# Patient Record
Sex: Male | Born: 1960
Health system: Southern US, Community
[De-identification: ages and names within clinical notes are randomized; demographics above are authoritative.]

## PROBLEM LIST (undated history)

## (undated) DIAGNOSIS — I10 Essential (primary) hypertension: Secondary | ICD-10-CM

## (undated) DIAGNOSIS — E785 Hyperlipidemia, unspecified: Secondary | ICD-10-CM

## (undated) HISTORY — PX: APPENDECTOMY: SHX54

---

## 2008-06-13 ENCOUNTER — Ambulatory Visit: Payer: Self-pay | Admitting: Family Medicine

## 2008-06-13 DIAGNOSIS — I10 Essential (primary) hypertension: Secondary | ICD-10-CM

## 2008-06-13 DIAGNOSIS — M25579 Pain in unspecified ankle and joints of unspecified foot: Secondary | ICD-10-CM

## 2008-06-13 DIAGNOSIS — E785 Hyperlipidemia, unspecified: Secondary | ICD-10-CM

## 2008-11-04 ENCOUNTER — Ambulatory Visit: Payer: Self-pay | Admitting: Family Medicine

## 2008-11-04 DIAGNOSIS — M75101 Unspecified rotator cuff tear or rupture of right shoulder, not specified as traumatic: Secondary | ICD-10-CM

## 2008-11-04 DIAGNOSIS — M75102 Unspecified rotator cuff tear or rupture of left shoulder, not specified as traumatic: Secondary | ICD-10-CM

## 2011-12-27 ENCOUNTER — Emergency Department
Admission: EM | Admit: 2011-12-27 | Discharge: 2011-12-27 | Disposition: A | Discharge status: Home / Self Care | Payer: Self-pay | Source: Home / Self Care | Attending: Emergency Medicine | Admitting: Emergency Medicine

## 2011-12-27 ENCOUNTER — Encounter: Payer: Self-pay | Admitting: *Deleted

## 2011-12-27 DIAGNOSIS — J069 Acute upper respiratory infection, unspecified: Secondary | ICD-10-CM

## 2011-12-27 DIAGNOSIS — J029 Acute pharyngitis, unspecified: Secondary | ICD-10-CM

## 2011-12-27 HISTORY — DX: Essential (primary) hypertension: I10

## 2011-12-27 HISTORY — DX: Hyperlipidemia, unspecified: E78.5

## 2011-12-27 MED ORDER — PENICILLIN G BENZATHINE 1200000 UNIT/2ML IM SUSP
1.2000 10*6.[IU] | Freq: Once | INTRAMUSCULAR | Status: AC
  Administered 2011-12-27: 1.2 10*6.[IU] via INTRAMUSCULAR

## 2011-12-27 NOTE — ED Notes (Signed)
Patient c/o sore throat x 2 days. Dry cough yesterday.

## 2011-12-27 NOTE — ED Provider Notes (Signed)
History     CSN: [id number]  Arrival date & time 12/27/11  7829   First MD Initiated Contact with Patient 12/27/11 [phone]      Chief Complaint  Patient presents with  . Sore Throat    (Consider location/radiation/quality/duration/timing/severity/associated sxs/prior treatment) HPI Joel James is a 51 y.o. male who complains of onset of cold symptoms for 2 days.  The symptoms are constant and mild-moderate in severity.  He is flying to [place] tomorrow to get married.  He was told that he is allergic to Amoxicillin, but only got nauseated from it as a child (no serious reaction).  He is not taking any medications.   + sore throat +/- cough No pleuritic pain No wheezing No nasal congestion No post-nasal drainage No sinus pain/pressure No chest congestion No itchy/red eyes No earache No hemoptysis No SOB No chills/sweats No fever No nausea No vomiting No abdominal pain No diarrhea No skin rashes No fatigue No myalgias No headache    Past Medical History  Diagnosis Date  . Hypertension   . Hyperlipidemia     Past Surgical History  Procedure Date  . Appendectomy     Family History  Problem Relation Age of Onset  . Heart failure Father   . Hypertension Father   . Diabetes Father     History  Substance Use Topics  . Smoking status: Never Smoker   . Smokeless tobacco: Not on file  . Alcohol Use: Yes      Review of Systems  All other systems reviewed and are negative.    Allergies  Codeine; Penicillins; and Propoxyphene hcl  Home Medications   Current Outpatient Rx  Name Route Sig Dispense Refill  . ASPIRIN 81 MG PO TABS Oral Take 81 mg by mouth daily.    Marland Kitchen OLMESARTAN-AMLODIPINE-HCTZ 40-10-25 MG PO TABS Oral Take by mouth.    Marland Kitchen LIVALO PO Oral Take by mouth.    . TEMAZEPAM 7.5 MG PO CAPS Oral Take 7.5 mg by mouth at bedtime as needed.      BP 136/89  Pulse 68  Temp 98.4 F (36.9 C) (Oral)  Resp 14  Ht 5\' 11"  (1.803 m)  Wt 183 lb (83.008 kg)   BMI 25.52 kg/m2  SpO2 100%  Physical Exam  Nursing note and vitals reviewed. Constitutional: He is oriented to person, place, and time. He appears well-developed and well-nourished.  HENT:  Head: Normocephalic and atraumatic.  Right Ear: Tympanic membrane, external ear and ear canal normal.  Left Ear: Tympanic membrane, external ear and ear canal normal.  Nose: Mucosal edema present.  Mouth/Throat: No oropharyngeal exudate, posterior oropharyngeal edema or posterior oropharyngeal erythema.  Eyes: No scleral icterus.  Neck: Neck supple.  Cardiovascular: Regular rhythm and normal heart sounds.   Pulmonary/Chest: Effort normal and breath sounds normal. No respiratory distress. He has no decreased breath sounds. He has no wheezes. He has no rhonchi.  Neurological: He is alert and oriented to person, place, and time.  Skin: Skin is warm and dry.  Psychiatric: He has a normal mood and affect. His speech is normal.    ED Course  Procedures (including critical care time)  Labs Reviewed - No data to display No results found.   1. Acute pharyngitis   2. Acute upper respiratory infections of unspecified site       MDM  1)  Rapid strep negative.  However, with trip tomorrow and the necessity to be well quickly, will treat with Bicillin IM.  We  kept him in clinic for 20 mins afterward to confirm no immediate reaction.  Will call back if having any other symptoms. I told him that if viral, then will need to run it's course.  He will also be having a medical exam when he gets to [place] because of the wedding.   2)  Use nasal saline solution (over the counter) at least 3 times a day. 3)  Use over the counter decongestants like Zyrtec-D every 12 hours as needed to help with congestion.  If you have hypertension, do not take medicines with sudafed.  4)  Can take tylenol every 6 hours or motrin every 8 hours for pain or fever. 5)  Follow up with your primary doctor if no improvement in 5-7 days,  sooner if increasing pain, fever, or new symptoms.        Marlaine Hind, MD 12/27/11 [phone]

## 2012-02-20 ENCOUNTER — Emergency Department
Admission: EM | Admit: 2012-02-20 | Discharge: 2012-02-20 | Disposition: A | Discharge status: Home / Self Care | Payer: BC Managed Care – PPO | Source: Home / Self Care

## 2012-02-20 ENCOUNTER — Encounter: Payer: Self-pay | Admitting: *Deleted

## 2012-02-20 DIAGNOSIS — S61419A Laceration without foreign body of unspecified hand, initial encounter: Secondary | ICD-10-CM

## 2012-02-20 DIAGNOSIS — S61409A Unspecified open wound of unspecified hand, initial encounter: Secondary | ICD-10-CM

## 2012-02-20 MED ORDER — DOXYCYCLINE HYCLATE 100 MG PO CAPS: 100.0000 mg | ORAL_CAPSULE | Freq: Two times a day (BID) | ORAL | Status: AC

## 2012-02-20 MED ORDER — TETANUS-DIPHTH-ACELL PERTUSSIS 5-2.5-18.5 LF-MCG/0.5 IM SUSP
0.5000 mL | Freq: Once | INTRAMUSCULAR | Status: AC
  Administered 2012-02-20: 0.5 mL via INTRAMUSCULAR

## 2012-02-20 NOTE — ED Notes (Signed)
Pt c/o RT 5th digit laceration x last night. He states that he was pushing the trash down in the trash can at home and cut it on a can. He is unsure of his last Tdap.

## 2012-02-20 NOTE — ED Provider Notes (Signed)
History     CSN: [id number]  Arrival date & time 02/20/12  0856   None     Chief Complaint  Patient presents with  . Extremity Laceration    Patient is a 51 y.o. male presenting with skin laceration.  Laceration  The incident occurred yesterday. The laceration is located on the right hand. Size: 1.5 cm  Injury mechanism: pt was pushing down trash and cut hand on can. The pain is at a severity of 4/10. The pain is mild. The pain has been fluctuating since onset. He reports no foreign bodies present. His tetanus status is unknown.  Pt believes he may have cut hand on old can of dog food.    Past Medical History  Diagnosis Date  . Hypertension   . Hyperlipidemia     Past Surgical History  Procedure Date  . Appendectomy     Family History  Problem Relation Age of Onset  . Heart failure Father   . Hypertension Father   . Diabetes Father   . Hyperlipidemia Father   . Hyperlipidemia Mother   . Hypertension Mother     History  Substance Use Topics  . Smoking status: Never Smoker   . Smokeless tobacco: Not on file  . Alcohol Use: Yes      Review of Systems  All other systems reviewed and are negative.    Allergies  Codeine; Penicillins; and Propoxyphene hcl  Home Medications   Current Outpatient Rx  Name Route Sig Dispense Refill  . ASPIRIN 81 MG PO TABS Oral Take 81 mg by mouth daily.    Marland Kitchen DOXYCYCLINE HYCLATE 100 MG PO CAPS Oral Take 1 capsule (100 mg total) by mouth 2 (two) times daily. 14 capsule 0  . OLMESARTAN-AMLODIPINE-HCTZ 40-10-25 MG PO TABS Oral Take by mouth.    Marland Kitchen LIVALO PO Oral Take by mouth.    . TEMAZEPAM 7.5 MG PO CAPS Oral Take 7.5 mg by mouth at bedtime as needed.      BP 125/82  Pulse 76  Temp 98 F (36.7 C) (Oral)  Resp 18  Ht 5\' 11"  (1.803 m)  Wt 187 lb 4 oz (84.936 kg)  BMI 26.12 kg/m2  SpO2 100%  Physical Exam  Constitutional: He appears well-developed and well-nourished.  HENT:  Head: Normocephalic and atraumatic.  Eyes:  Conjunctivae are normal. Pupils are equal, round, and reactive to light.  Neck: Normal range of motion. Neck supple.  Cardiovascular: Normal rate and regular rhythm.   Pulmonary/Chest: Effort normal and breath sounds normal.  Abdominal: Soft.  Musculoskeletal: Normal range of motion.       Hands:      Noted 5th digit laceration measuring approx 1.5 cm.   Neurological: He is alert.    ED Course  Procedures (including critical care time) Laceration Repair; Area soaked in chlorhexidine, then copiously irrigated with normal saline. Affected area anesthetized with 2% lidocaine without epinephrine. Area sutured in interrupted fashion with 5-0 ethilon. Area dressed with topical antibiotic ointment and wrapping. Overall procedure tolerated well.  Labs Reviewed - No data to display No results found.   1. Hand laceration       MDM  Area irrigated and sutured at bedside.  Tetanus shot given.  Will place on abx as wound has been open for >12 hours as well as etiology of wound (garbage can).  Discussed infectious red flags.  Follow up in 3-5 days for wound recheck. Remove sutures in 7 days.     The  patient and/or caregiver has been counseled thoroughly with regard to treatment plan and/or medications prescribed including dosage, schedule, interactions, rationale for use, and possible side effects and they verbalize understanding. Diagnoses and expected course of recovery discussed and will return if not improved as expected or if the condition worsens. Patient and/or caregiver verbalized understanding.               Doree Albee, MD 02/20/12 1005  Doree Albee, MD 02/20/12 1006

## 2012-02-23 NOTE — ED Provider Notes (Signed)
Agree with exam, assessment, and plan.   Arnet Hofferber A Diante Barley, MD 02/23/12 1430 

## 2012-10-01 ENCOUNTER — Emergency Department
Admission: EM | Admit: 2012-10-01 | Discharge: 2012-10-01 | Disposition: A | Discharge status: Home / Self Care | Payer: BC Managed Care – PPO | Source: Home / Self Care | Attending: Family Medicine | Admitting: Family Medicine

## 2012-10-01 ENCOUNTER — Emergency Department (INDEPENDENT_AMBULATORY_CARE_PROVIDER_SITE_OTHER): Payer: BC Managed Care – PPO

## 2012-10-01 ENCOUNTER — Encounter: Payer: Self-pay | Admitting: *Deleted

## 2012-10-01 ENCOUNTER — Ambulatory Visit (INDEPENDENT_AMBULATORY_CARE_PROVIDER_SITE_OTHER): Payer: BC Managed Care – PPO | Admitting: Sports Medicine

## 2012-10-01 DIAGNOSIS — M67919 Unspecified disorder of synovium and tendon, unspecified shoulder: Secondary | ICD-10-CM

## 2012-10-01 DIAGNOSIS — M25519 Pain in unspecified shoulder: Secondary | ICD-10-CM

## 2012-10-01 DIAGNOSIS — M7551 Bursitis of right shoulder: Secondary | ICD-10-CM

## 2012-10-01 DIAGNOSIS — M75101 Unspecified rotator cuff tear or rupture of right shoulder, not specified as traumatic: Secondary | ICD-10-CM

## 2012-10-01 MED ORDER — MELOXICAM 15 MG PO TABS: ORAL_TABLET | ORAL | Status: DC

## 2012-10-01 NOTE — Progress Notes (Signed)
   Subjective:    I'm seeing this patient as a consultation for:  Dr. Cathren Harsh.  CC: Right shoulder pain  HPI: Right shoulder pain: Has been on and off for approximately 10 years without any trauma. Pain is localized over the deltoid and is worse with overhead activities and reaching behind his back. He denies any pain in his neck he denies any pain radiating down the arm past the elbow or to the fingertips. Has taken some ibuprofen unfortunately pain is starting to wake him up at night.  Symptoms are moderate, worsening. Has never had an injection in never had physical therapy.  Past medical history, Surgical history, Family history not pertinant except as noted below, Social history, Allergies, and medications have been entered into the medical record, reviewed, and no changes needed.   Review of Systems: No headache, visual changes, nausea, vomiting, diarrhea, constipation, dizziness, abdominal pain, skin rash, fevers, chills, night sweats, weight loss, swollen lymph nodes, body aches, joint swelling, muscle aches, chest pain, shortness of breath, mood changes, visual or auditory hallucinations.   Objective:   General: Well Developed, well nourished, and in no acute distress.  Neuro/Psych: Alert and oriented x3, extra-ocular muscles intact, able to move all 4 extremities, sensation grossly intact. Skin: Warm and dry, no rashes noted.  Respiratory: Not using accessory muscles, speaking in full sentences, trachea midline.  Cardiovascular: Pulses palpable, no extremity edema. Abdomen: Does not appear distended. Right Shoulder: Inspection reveals no abnormalities, atrophy or asymmetry. Palpation is normal with no tenderness over AC joint or bicipital groove. ROM is full in all planes. Rotator cuff strength fairly normal, but pain with resisted abduction.  Positive Neer's, Hawkin's, and Empty Can test Speeds and Yergason's tests normal. No labral pathology noted with negative Obrien's,  negative clunk and good stability. Positive crank test. Normal scapular function observed. No painful arc and no drop arm sign. No apprehension sign  X-rays were reviewed and show mild acromioclavicular DJD, otherwise negative.  Procedure:  Injection of right subacromial bursa Consent obtained and verified. Time-out conducted. Noted no overlying erythema, induration, or other signs of local infection. Skin prepped in a sterile fashion. Topical analgesic spray: Ethyl chloride. Completed without difficulty. Meds: 25-gauge needle advanced from a posterior approach skimming just under the acromion. 1 cc Kenalog 40, 3 cc lidocaine injected easily. Pain immediately improved suggesting accurate placement of the medication. Advised to call if fevers/chills, erythema, induration, drainage, or persistent bleeding. Impression and Recommendations:   This case required medical decision making of moderate complexity.

## 2012-10-01 NOTE — ED Notes (Signed)
Pt c/o RT shoulder pain x 1 wk. Denies injury. No OTC meds.

## 2012-10-01 NOTE — Assessment & Plan Note (Signed)
Blind injection into the subacromial bursa as above. Mobic, home exercises. Return in 4 weeks.

## 2012-10-01 NOTE — ED Provider Notes (Signed)
History     CSN: [id number]  Arrival date & time 10/01/12  4098   First MD Initiated Contact with Patient 10/01/12 [phone]      Chief Complaint  Patient presents with  . Shoulder Pain       HPI Comments: Patient complains of pain in right shoulder for one week.  The pain awakens him at night.  He uses a computer mouse most of the day at work.  He recalls no injury.  He has had no recent change in his physical activities.  Patient is a 52 y.o. male presenting with shoulder pain. The history is provided by the patient.  Shoulder Pain This is a recurrent problem. Episode onset: 1 week ago. The problem occurs constantly. The problem has not changed since onset.Associated symptoms comments: none. Exacerbated by: Moving his shoulder. Nothing relieves the symptoms. Treatments tried: Excedrin. The treatment provided no relief.    Past Medical History  Diagnosis Date  . Hypertension   . Hyperlipidemia     Past Surgical History  Procedure Laterality Date  . Appendectomy      Family History  Problem Relation Age of Onset  . Heart failure Father   . Hypertension Father   . Diabetes Father   . Hyperlipidemia Father   . Hyperlipidemia Mother   . Hypertension Mother     History  Substance Use Topics  . Smoking status: Never Smoker   . Smokeless tobacco: Not on file  . Alcohol Use: Yes      Review of Systems  All other systems reviewed and are negative.    Allergies  Codeine; Penicillins; and Propoxyphene hcl  Home Medications   Current Outpatient Rx  Name  Route  Sig  Dispense  Refill  . UNKNOWN TO PATIENT               . aspirin 81 MG tablet   Oral   Take 81 mg by mouth daily.         . meloxicam (MOBIC) 15 MG tablet      One tab PO qAM with breakfast for 2 weeks, then daily prn pain.   30 tablet   3   . Olmesartan-Amlodipine-HCTZ (TRIBENZOR) 40-10-25 MG TABS   Oral   Take by mouth.         . Pitavastatin Calcium (LIVALO PO)   Oral   Take by  mouth.         . temazepam (RESTORIL) 7.5 MG capsule   Oral   Take 7.5 mg by mouth at bedtime as needed.           BP 112/77  Pulse 73  Temp(Src) 97.7 F (36.5 C) (Oral)  Ht 5\' 11"  (1.803 m)  Wt 188 lb (85.276 kg)  BMI 26.23 kg/m2  SpO2 97%  Physical Exam  Nursing note and vitals reviewed. Constitutional: He is oriented to person, place, and time. He appears well-developed and well-nourished. No distress.  HENT:  Head: Normocephalic.  Eyes: Conjunctivae are normal. Pupils are equal, round, and reactive to light.  Neck: Normal range of motion.  Cardiovascular: Normal heart sounds.   Pulmonary/Chest: Breath sounds normal.  Musculoskeletal: Normal range of motion.       Right shoulder: He exhibits tenderness. He exhibits normal range of motion, no bony tenderness, no swelling, no effusion and no crepitus.       Arms: There is mild tenderness at right sub-acromial notch.  Exam relatively benign.  Neer's test is positive for subacromial  impingement.  Lymphadenopathy:    He has no cervical adenopathy.  Neurological: He is alert and oriented to person, place, and time.  Skin: Skin is warm and dry. No rash noted.    ED Course  Procedures  none   Dg Shoulder Right  10/01/2012  *RADIOLOGY REPORT*  Clinical Data: Superior lateral shoulder pain.  RIGHT SHOULDER - 2+ VIEW  Comparison: None.  Findings: No fracture or dislocation.  No degenerative changes. Visualized portion of the right chest is unremarkable.  IMPRESSION: Negative.   Original Report Authenticated By: Leanna Battles, M.D.      1. Acute shoulder bursitis, right       MDM  Will consult with Dr. Rodney Langton for further management, and possible corticosteroid injection.        Lattie Haw, MD 10/01/12 1116

## 2013-10-14 ENCOUNTER — Emergency Department
Admission: EM | Admit: 2013-10-14 | Discharge: 2013-10-14 | Disposition: A | Discharge status: Home / Self Care | Payer: BC Managed Care – PPO | Source: Home / Self Care | Attending: Family Medicine | Admitting: Family Medicine

## 2013-10-14 ENCOUNTER — Emergency Department (INDEPENDENT_AMBULATORY_CARE_PROVIDER_SITE_OTHER): Payer: BC Managed Care – PPO

## 2013-10-14 ENCOUNTER — Encounter: Payer: Self-pay | Admitting: Emergency Medicine

## 2013-10-14 DIAGNOSIS — R079 Chest pain, unspecified: Secondary | ICD-10-CM

## 2013-10-14 LAB — TROPONIN I

## 2013-10-14 NOTE — ED Notes (Signed)
Apt made with Dr. Jens Somrenshaw for 10/23/13 in [place] office.

## 2013-10-14 NOTE — ED Provider Notes (Signed)
CSN: [id number]     Arrival date & time 10/14/13  0909 History   First MD Initiated Contact with Patient 10/14/13 0915     Chief Complaint  Patient presents with  . Chest Pain    HPI Patient presents today with chief complaint of chest pain. Patient reports having left-sided chest pain since yesterday. Denies any associated nausea diaphoresis. Pain does not radiate to the neck or the arm. Pain is not worse with exertion.  Pain seems to be worse with deep breathing, coughing, laughing as well as certain movements. Denies any recent strenuous activity.  Took a full dose aspirin yesterday and today with no change in symptoms. + family hx/o heart disease in father with fatal MI in 4360s, brother s/p CABG + HLD and HTN Had cardiac workup about 20 years ago that was normal.  Last saw a cardiologist 15 years ago.   Past Medical History  Diagnosis Date  . Hypertension   . Hyperlipidemia    Past Surgical History  Procedure Laterality Date  . Appendectomy     Family History  Problem Relation Age of Onset  . Heart failure Father   . Hypertension Father   . Diabetes Father   . Hyperlipidemia Father   . Hyperlipidemia Mother   . Hypertension Mother    History  Substance Use Topics  . Smoking status: Never Smoker   . Smokeless tobacco: Not on file  . Alcohol Use: Yes    Review of Systems  All other systems reviewed and are negative.   Allergies  Codeine; Penicillins; and Propoxyphene hcl  Home Medications   Current Outpatient Rx  Name  Route  Sig  Dispense  Refill  . aspirin 81 MG tablet   Oral   Take 81 mg by mouth daily.         . meloxicam (MOBIC) 15 MG tablet      One tab PO qAM with breakfast for 2 weeks, then daily prn pain.   30 tablet   3   . Olmesartan-Amlodipine-HCTZ (TRIBENZOR) 40-10-25 MG TABS   Oral   Take by mouth.         . Pitavastatin Calcium (LIVALO PO)   Oral   Take by mouth.         . temazepam (RESTORIL) 7.5 MG capsule   Oral   Take  7.5 mg by mouth at bedtime as needed.         Marland Kitchen. UNKNOWN TO PATIENT                There were no vitals taken for this visit. Physical Exam  Constitutional: He appears well-developed and well-nourished.  HENT:  Head: Normocephalic and atraumatic.  Eyes: Conjunctivae are normal. Pupils are equal, round, and reactive to light.  Neck: Normal range of motion. Neck supple.  Cardiovascular: Normal rate and regular rhythm.   Pulmonary/Chest: Effort normal and breath sounds normal.  + mild L sided CP with thoracic expansion.  Abdominal: Soft.  Musculoskeletal: Normal range of motion.  Neurological: He is alert.  Skin: Skin is warm.    ED Course  Procedures (including critical care time) Labs Review Labs Reviewed - No data to display Imaging Review Dg Chest 2 View  10/14/2013   CLINICAL DATA:  53 year old male with chest pain. Initial encounter.  EXAM: CHEST  2 VIEW  COMPARISON:  11/04/2008.  FINDINGS: Mildly lower lung volumes. Normal cardiac size and mediastinal contours. Visualized tracheal air column is within normal limits. The lungs  remain clear. No pneumothorax or effusion. No acute osseous abnormality identified.  IMPRESSION: Lower lung volumes otherwise negative, with no acute cardiopulmonary abnormality.   Electronically Signed   By: Augusto GambleLee  Hall M.D.   On: 10/14/2013 09:55   EKG: NSR   MDM   1. Chest pain    Sxs atypical for cardiac source. More consisent with MSK component as pain is reproducible with movement and deep breathing. ? Costochondritis.  CXR, EKG, trop x1 negative.  Continue mobic, prn tylenol.  Continue daily ASA.  Still at higher risk of cardiac disease given RFs.  Will set up for referral to cardiology for stress test.  Go to ER if sxs worsen or change.     The patient and/or caregiver has been counseled thoroughly with regard to treatment plan and/or medications prescribed including dosage, schedule, interactions, rationale for use, and possible side  effects and they verbalize understanding. Diagnoses and expected course of recovery discussed and will return if not improved as expected or if the condition worsens. Patient and/or caregiver verbalized understanding.          Doree AlbeeSteven Newton, MD 10/14/13 1228

## 2013-10-14 NOTE — ED Notes (Signed)
Joel James c/o left sided CP that started suddenly yesterday. Pain is constant and described as sharp and dull. No associated symptoms such as N/V, diaphoresis, left arm numbness, or jaw pain. Neg smoking Hx. Positive family Hx of MI. He has already taken his 325 ASA today.

## 2013-10-16 ENCOUNTER — Ambulatory Visit: Payer: BC Managed Care – PPO | Admitting: Cardiology

## 2013-10-17 ENCOUNTER — Telehealth: Payer: Self-pay | Admitting: Emergency Medicine

## 2013-10-23 ENCOUNTER — Ambulatory Visit: Payer: BC Managed Care – PPO | Admitting: Cardiology

## 2013-12-13 ENCOUNTER — Ambulatory Visit (INDEPENDENT_AMBULATORY_CARE_PROVIDER_SITE_OTHER): Payer: BC Managed Care – PPO | Admitting: Sports Medicine

## 2013-12-13 ENCOUNTER — Encounter: Payer: Self-pay | Admitting: Sports Medicine

## 2013-12-13 VITALS — BP 125/81 | HR 70 | Ht 71.0 in | Wt 191.0 lb

## 2013-12-13 DIAGNOSIS — M719 Bursopathy, unspecified: Secondary | ICD-10-CM

## 2013-12-13 DIAGNOSIS — M67919 Unspecified disorder of synovium and tendon, unspecified shoulder: Secondary | ICD-10-CM

## 2013-12-13 DIAGNOSIS — M75101 Unspecified rotator cuff tear or rupture of right shoulder, not specified as traumatic: Secondary | ICD-10-CM

## 2013-12-13 NOTE — Assessment & Plan Note (Signed)
Solid three-month response to last subacromial injection. Repeat subacromial injection this time under guidance. Return as needed.

## 2013-12-13 NOTE — Progress Notes (Signed)
    Subjective:    CC: Followup  HPI: Right rotator cuff syndrome: Excellent response to previous subacromial injection, desires repeat, pain is worse of the deltoid, worse with overhead activities, moderate, persistent without trauma.  Past medical history, Surgical history, Family history not pertinant except as noted below, Social history, Allergies, and medications have been entered into the medical record, reviewed, and no changes needed.   Review of Systems: No fevers, chills, night sweats, weight loss, chest pain, or shortness of breath.   Objective:    General: Well Developed, well nourished, and in no acute distress.  Neuro: Alert and oriented x3, extra-ocular muscles intact, sensation grossly intact.  HEENT: Normocephalic, atraumatic, pupils equal round reactive to light, neck supple, no masses, no lymphadenopathy, thyroid nonpalpable.  Skin: Warm and dry, no rashes. Cardiac: Regular rate and rhythm, no murmurs rubs or gallops, no lower extremity edema. Respiratory: Clear to auscultation bilaterally. Not using accessory muscles, speaking in full sentences.  Procedure: Real-time Ultrasound Guided Injection of right subacromial bursa Device: GE Logiq E  Verbal informed consent obtained.  Time-out conducted.  Noted no overlying erythema, induration, or other signs of local infection.  Skin prepped in a sterile fashion.  Local anesthesia: Topical Ethyl chloride.  With sterile technique and under real time ultrasound guidance: 1 cc Kenalog 40, 3 cc lidocaine injected easily into the subacromial bursa.  Completed without difficulty  Pain immediately resolved suggesting accurate placement of the medication.  Advised to call if fevers/chills, erythema, induration, drainage, or persistent bleeding.  Images permanently stored and available for review in the ultrasound unit.  Impression: Technically successful ultrasound guided injection.  Impression and Recommendations:

## 2014-04-23 ENCOUNTER — Ambulatory Visit (INDEPENDENT_AMBULATORY_CARE_PROVIDER_SITE_OTHER): Payer: BC Managed Care – PPO | Admitting: Sports Medicine

## 2014-04-23 ENCOUNTER — Encounter: Payer: Self-pay | Admitting: Sports Medicine

## 2014-04-23 VITALS — BP 114/76 | HR 67 | Ht 71.0 in | Wt 189.0 lb

## 2014-04-23 DIAGNOSIS — M75101 Unspecified rotator cuff tear or rupture of right shoulder, not specified as traumatic: Secondary | ICD-10-CM

## 2014-04-23 NOTE — Progress Notes (Signed)
  Subjective:    CC: Right shoulder pain  HPI: This is a pleasant 53 year old male, I saw him approximately 4 months ago, he had a rotator cuff-type syndrome and we injected the subacromial bursa. He had a four-month response but now has recurrence of pain at this time in a different location, posteriorly at the joint line, worse with reaching behind/external rotation.  Past medical history, Surgical history, Family history not pertinant except as noted below, Social history, Allergies, and medications have been entered into the medical record, reviewed, and no changes needed.   Review of Systems: No fevers, chills, night sweats, weight loss, chest pain, or shortness of breath.   Objective:    General: Well Developed, well nourished, and in no acute distress.  Neuro: Alert and oriented x3, extra-ocular muscles intact, sensation grossly intact.  HEENT: Normocephalic, atraumatic, pupils equal round reactive to light, neck supple, no masses, no lymphadenopathy, thyroid nonpalpable.  Skin: Warm and dry, no rashes. Cardiac: Regular rate and rhythm, no murmurs rubs or gallops, no lower extremity edema.  Respiratory: Clear to auscultation bilaterally. Not using accessory muscles, speaking in full sentences. Right Shoulder: Inspection reveals no abnormalities, atrophy or asymmetry. Palpation is normal with no tenderness over AC joint or bicipital groove. ROM is full in all planes. Rotator cuff strength normal throughout. No signs of impingement with negative Neer and Hawkin's tests, empty can. Speeds and Yergason's tests normal. Positive crank test, negative O'Brien test. Negative clunk test. Normal scapular function observed. No painful arc and no drop arm sign. No apprehension sign  Procedure: Real-time Ultrasound Guided Injection of right glenohumeral joint Device: GE Logiq E  Verbal informed consent obtained.  Time-out conducted.  Noted no overlying erythema, induration, or other signs  of local infection.  Skin prepped in a sterile fashion.  Local anesthesia: Topical Ethyl chloride.  With sterile technique and under real time ultrasound guidance:  Using a spinal needle and taking care to avoid the labrum I entered the joint capsule, 1 cc kenalog 40, 4 cc lidocaine injected easily. Completed without difficulty  Pain immediately resolved suggesting accurate placement of the medication.  Advised to call if fevers/chills, erythema, induration, drainage, or persistent bleeding.  Images permanently stored and available for review in the ultrasound unit.  Impression: Technically successful ultrasound guided injection.  Impression and Recommendations:

## 2014-04-23 NOTE — Assessment & Plan Note (Addendum)
Four-month response to the most recent subacromial injection. Pain today is more referable to the glenohumeral joint. Glenohumeral joint injection as above, the size of the importance of shoulder rehabilitation exercises. Return in one month.

## 2014-05-19 ENCOUNTER — Ambulatory Visit: Payer: BC Managed Care – PPO | Admitting: Sports Medicine

## 2015-05-11 ENCOUNTER — Ambulatory Visit (INDEPENDENT_AMBULATORY_CARE_PROVIDER_SITE_OTHER): Payer: BLUE CROSS/BLUE SHIELD | Admitting: Sports Medicine

## 2015-05-11 ENCOUNTER — Encounter: Payer: Self-pay | Admitting: Sports Medicine

## 2015-05-11 VITALS — BP 125/80 | HR 77 | Temp 98.0°F | Resp 18 | Wt 175.2 lb

## 2015-05-11 DIAGNOSIS — M75101 Unspecified rotator cuff tear or rupture of right shoulder, not specified as traumatic: Secondary | ICD-10-CM | POA: Diagnosis not present

## 2015-05-11 NOTE — Assessment & Plan Note (Signed)
16 month response to previous subacromial injection, twelve-month response to prior glenohumeral injection, symptoms are predominantly impingement today so we injected his subacromial bursa, return as needed.

## 2015-05-11 NOTE — Progress Notes (Signed)
  Subjective:    CC: Right shoulder pain  HPI: This is a pleasant 54 year old male, one year ago we injected his right glenohumeral joint, 16 months ago we injected his right subcarinal bursa, he started to have a recurrence of pain that he localizes over the lateral shoulder, moderate, persistent, no radiation, worse overhead activities, no trauma. Desires repeat interventional treatment.  Past medical history, Surgical history, Family history not pertinant except as noted below, Social history, Allergies, and medications have been entered into the medical record, reviewed, and no changes needed.   Review of Systems: No fevers, chills, night sweats, weight loss, chest pain, or shortness of breath.   Objective:    General: Well Developed, well nourished, and in no acute distress.  Neuro: Alert and oriented x3, extra-ocular muscles intact, sensation grossly intact.  HEENT: Normocephalic, atraumatic, pupils equal round reactive to light, neck supple, no masses, no lymphadenopathy, thyroid nonpalpable.  Skin: Warm and dry, no rashes. Cardiac: Regular rate and rhythm, no murmurs rubs or gallops, no lower extremity edema.  Respiratory: Clear to auscultation bilaterally. Not using accessory muscles, speaking in full sentences. Right Shoulder: Inspection reveals no abnormalities, atrophy or asymmetry. Palpation is normal with no tenderness over AC joint or bicipital groove. ROM is full in all planes. Rotator cuff strength normal throughout. Positive Neer and Hawkin's tests, empty can. Speeds and Yergason's tests normal. No labral pathology noted with negative Obrien's, negative crank, negative clunk, and good stability. Normal scapular function observed. No painful arc and no drop arm sign. No apprehension sign  Procedure: Real-time Ultrasound Guided Injection of right subacromial bursa Device: GE Logiq E  Verbal informed consent obtained.  Time-out conducted.  Noted no overlying  erythema, induration, or other signs of local infection.  Skin prepped in a sterile fashion.  Local anesthesia: Topical Ethyl chloride.  With sterile technique and under real time ultrasound guidance:  1 mL kenalog 40, 3 mL lidocaine injected easily into the subacromial bursa taking care to avoid injection into the supraspinatus. Completed without difficulty  Pain immediately resolved suggesting accurate placement of the medication.  Advised to call if fevers/chills, erythema, induration, drainage, or persistent bleeding.  Images permanently stored and available for review in the ultrasound unit.  Impression: Technically successful ultrasound guided injection.  Impression and Recommendations:

## 2015-10-06 ENCOUNTER — Ambulatory Visit (INDEPENDENT_AMBULATORY_CARE_PROVIDER_SITE_OTHER): Payer: BLUE CROSS/BLUE SHIELD | Admitting: Family Medicine

## 2015-10-06 ENCOUNTER — Encounter: Payer: Self-pay | Admitting: Family Medicine

## 2015-10-06 VITALS — BP 143/84 | HR 73 | Wt 176.0 lb

## 2015-10-06 DIAGNOSIS — M25512 Pain in left shoulder: Secondary | ICD-10-CM | POA: Insufficient documentation

## 2015-10-06 DIAGNOSIS — I10 Essential (primary) hypertension: Secondary | ICD-10-CM | POA: Diagnosis not present

## 2015-10-06 DIAGNOSIS — M75101 Unspecified rotator cuff tear or rupture of right shoulder, not specified as traumatic: Secondary | ICD-10-CM | POA: Diagnosis not present

## 2015-10-06 NOTE — Assessment & Plan Note (Signed)
Significant improvement following subacromial bursa injection. Plan for home exercise program and return as needed.

## 2015-10-06 NOTE — Assessment & Plan Note (Signed)
Patient has rotator cuff tendinitis is seen on ultrasound with components of impingement or bursitis. He had significant improvement following injection. Plan for home exercise program and return as needed.

## 2015-10-06 NOTE — Patient Instructions (Signed)
Thank you for coming in today. Call or go to the ER if you develop a large red swollen joint with extreme pain or oozing puss.   Impingement Syndrome, Rotator Cuff, Bursitis With Rehab Impingement syndrome is a condition that involves inflammation of the tendons of the rotator cuff and the subacromial bursa, that causes pain in the shoulder. The rotator cuff consists of four tendons and muscles that control much of the shoulder and upper arm function. The subacromial bursa is a fluid filled sac that helps reduce friction between the rotator cuff and one of the bones of the shoulder (acromion). Impingement syndrome is usually an overuse injury that causes swelling of the bursa (bursitis), swelling of the tendon (tendonitis), and/or a tear of the tendon (strain). Strains are classified into three categories. Grade 1 strains cause pain, but the tendon is not lengthened. Grade 2 strains include a lengthened ligament, due to the ligament being stretched or partially ruptured. With grade 2 strains there is still function, although the function may be decreased. Grade 3 strains include a complete tear of the tendon or muscle, and function is usually impaired. SYMPTOMS   Pain around the shoulder, often at the outer portion of the upper arm.  Pain that gets worse with shoulder function, especially when reaching overhead or lifting.  Sometimes, aching when not using the arm.  Pain that wakes you up at night.  Sometimes, tenderness, swelling, warmth, or redness over the affected area.  Loss of strength.  Limited motion of the shoulder, especially reaching behind the back (to the back pocket or to unhook bra) or across your body.  Crackling sound (crepitation) when moving the arm.  Biceps tendon pain and inflammation (in the front of the shoulder). Worse when bending the elbow or lifting. CAUSES  Impingement syndrome is often an overuse injury, in which chronic (repetitive) motions cause the tendons or  bursa to become inflamed. A strain occurs when a force is paced on the tendon or muscle that is greater than it can withstand. Common mechanisms of injury include: Stress from sudden increase in duration, frequency, or intensity of training.  Direct hit (trauma) to the shoulder.  Aging, erosion of the tendon with normal use.  Bony bump on shoulder (acromial spur). RISK INCREASES WITH:  Contact sports (football, wrestling, boxing).  Throwing sports (baseball, tennis, volleyball).  Weightlifting and bodybuilding.  Heavy labor.  Previous injury to the rotator cuff, including impingement.  Poor shoulder strength and flexibility.  Failure to warm up properly before activity.  Inadequate protective equipment.  Old age.  Bony bump on shoulder (acromial spur). PREVENTION   Warm up and stretch properly before activity.  Allow for adequate recovery between workouts.  Maintain physical fitness:  Strength, flexibility, and endurance.  Cardiovascular fitness.  Learn and use proper exercise technique. PROGNOSIS  If treated properly, impingement syndrome usually goes away within 6 weeks. Sometimes surgery is required.  RELATED COMPLICATIONS   Longer healing time if not properly treated, or if not given enough time to heal.  Recurring symptoms, that result in a chronic condition.  Shoulder stiffness, frozen shoulder, or loss of motion.  Rotator cuff tendon tear.  Recurring symptoms, especially if activity is resumed too soon, with overuse, with a direct blow, or when using poor technique. TREATMENT  Treatment first involves the use of ice and medicine, to reduce pain and inflammation. The use of strengthening and stretching exercises may help reduce pain with activity. These exercises may be performed at home or  with a therapist. If non-surgical treatment is unsuccessful after more than 6 months, surgery may be advised. After surgery and rehabilitation, activity is usually  possible in 3 months.  MEDICATION  If pain medicine is needed, nonsteroidal anti-inflammatory medicines (aspirin and ibuprofen), or other minor pain relievers (acetaminophen), are often advised.  Do not take pain medicine for 7 days before surgery.  Prescription pain relievers may be given, if your caregiver thinks they are needed. Use only as directed and only as much as you need.  Corticosteroid injections may be given by your caregiver. These injections should be reserved for the most serious cases, because they may only be given a certain number of times. HEAT AND COLD  Cold treatment (icing) should be applied for 10 to 15 minutes every 2 to 3 hours for inflammation and pain, and immediately after activity that aggravates your symptoms. Use ice packs or an ice massage.  Heat treatment may be used before performing stretching and strengthening activities prescribed by your caregiver, physical therapist, or athletic trainer. Use a heat pack or a warm water soak. SEEK MEDICAL CARE IF:   Symptoms get worse or do not improve in 4 to 6 weeks, despite treatment.  New, unexplained symptoms develop. (Drugs used in treatment may produce side effects.) EXERCISES  RANGE OF MOTION (ROM) AND STRETCHING EXERCISES - Impingement Syndrome (Rotator Cuff  Tendinitis, Bursitis) These exercises may help you when beginning to rehabilitate your injury. Your symptoms may go away with or without further involvement from your physician, physical therapist or athletic trainer. While completing these exercises, remember:   Restoring tissue flexibility helps normal motion to return to the joints. This allows healthier, less painful movement and activity.  An effective stretch should be held for at least 30 seconds.  A stretch should never be painful. You should only feel a gentle lengthening or release in the stretched tissue. STRETCH - Flexion, Standing  Stand with good posture. With an underhand grip on your  right / left hand, and an overhand grip on the opposite hand, grasp a broomstick or cane so that your hands are a little more than shoulder width apart.  Keeping your right / left elbow straight and shoulder muscles relaxed, push the stick with your opposite hand, to raise your right / left arm in front of your body and then overhead. Raise your arm until you feel a stretch in your right / left shoulder, but before you have increased shoulder pain.  Try to avoid shrugging your right / left shoulder as your arm rises, by keeping your shoulder blade tucked down and toward your mid-back spine. Hold for __________ seconds.  Slowly return to the starting position. Repeat __________ times. Complete this exercise __________ times per day. STRETCH - Abduction, Supine  Lie on your back. With an underhand grip on your right / left hand and an overhand grip on the opposite hand, grasp a broomstick or cane so that your hands are a little more than shoulder width apart.  Keeping your right / left elbow straight and your shoulder muscles relaxed, push the stick with your opposite hand, to raise your right / left arm out to the side of your body and then overhead. Raise your arm until you feel a stretch in your right / left shoulder, but before you have increased shoulder pain.  Try to avoid shrugging your right / left shoulder as your arm rises, by keeping your shoulder blade tucked down and toward your mid-back spine. Hold   for __________ seconds.  Slowly return to the starting position. Repeat __________ times. Complete this exercise __________ times per day. ROM - Flexion, Active-Assisted  Lie on your back. You may bend your knees for comfort.  Grasp a broomstick or cane so your hands are about shoulder width apart. Your right / left hand should grip the end of the stick, so that your hand is positioned "thumbs-up," as if you were about to shake hands.  Using your healthy arm to lead, raise your right /  left arm overhead, until you feel a gentle stretch in your shoulder. Hold for __________ seconds.  Use the stick to assist in returning your right / left arm to its starting position. Repeat __________ times. Complete this exercise __________ times per day.  ROM - Internal Rotation, Supine   Lie on your back on a firm surface. Place your right / left elbow about 60 degrees away from your side. Elevate your elbow with a folded towel, so that the elbow and shoulder are the same height.  Using a broomstick or cane and your strong arm, pull your right / left hand toward your body until you feel a gentle stretch, but no increase in your shoulder pain. Keep your shoulder and elbow in place throughout the exercise.  Hold for __________ seconds. Slowly return to the starting position. Repeat __________ times. Complete this exercise __________ times per day. STRETCH - Internal Rotation  Place your right / left hand behind your back, palm up.  Throw a towel or belt over your opposite shoulder. Grasp the towel with your right / left hand.  While keeping an upright posture, gently pull up on the towel, until you feel a stretch in the front of your right / left shoulder.  Avoid shrugging your right / left shoulder as your arm rises, by keeping your shoulder blade tucked down and toward your mid-back spine.  Hold for __________ seconds. Release the stretch, by lowering your healthy hand. Repeat __________ times. Complete this exercise __________ times per day. ROM - Internal Rotation   Using an underhand grip, grasp a stick behind your back with both hands.  While standing upright with good posture, slide the stick up your back until you feel a mild stretch in the front of your shoulder.  Hold for __________ seconds. Slowly return to your starting position. Repeat __________ times. Complete this exercise __________ times per day.  STRETCH - Posterior Shoulder Capsule   Stand or sit with good  posture. Grasp your right / left elbow and draw it across your chest, keeping it at the same height as your shoulder.  Pull your elbow, so your upper arm comes in closer to your chest. Pull until you feel a gentle stretch in the back of your shoulder.  Hold for __________ seconds. Repeat __________ times. Complete this exercise __________ times per day. STRENGTHENING EXERCISES - Impingement Syndrome (Rotator Cuff Tendinitis, Bursitis) These exercises may help you when beginning to rehabilitate your injury. They may resolve your symptoms with or without further involvement from your physician, physical therapist or athletic trainer. While completing these exercises, remember:  Muscles can gain both the endurance and the strength needed for everyday activities through controlled exercises.  Complete these exercises as instructed by your physician, physical therapist or athletic trainer. Increase the resistance and repetitions only as guided.  You may experience muscle soreness or fatigue, but the pain or discomfort you are trying to eliminate should never worsen during these exercises. If this   pain does get worse, stop and make sure you are following the directions exactly. If the pain is still present after adjustments, discontinue the exercise until you can discuss the trouble with your clinician.  During your recovery, avoid activity or exercises which involve actions that place your injured hand or elbow above your head or behind your back or head. These positions stress the tissues which you are trying to heal. STRENGTH - Scapular Depression and Adduction   With good posture, sit on a firm chair. Support your arms in front of you, with pillows, arm rests, or on a table top. Have your elbows in line with the sides of your body.  Gently draw your shoulder blades down and toward your mid-back spine. Gradually increase the tension, without tensing the muscles along the top of your shoulders and  the back of your neck.  Hold for __________ seconds. Slowly release the tension and relax your muscles completely before starting the next repetition.  After you have practiced this exercise, remove the arm support and complete the exercise in standing as well as sitting position. Repeat __________ times. Complete this exercise __________ times per day.  STRENGTH - Shoulder Abductors, Isometric  With good posture, stand or sit about 4-6 inches from a wall, with your right / left side facing the wall.  Bend your right / left elbow. Gently press your right / left elbow into the wall. Increase the pressure gradually, until you are pressing as hard as you can, without shrugging your shoulder or increasing any shoulder discomfort.  Hold for __________ seconds.  Release the tension slowly. Relax your shoulder muscles completely before you begin the next repetition. Repeat __________ times. Complete this exercise __________ times per day.  STRENGTH - External Rotators, Isometric  Keep your right / left elbow at your side and bend it 90 degrees.  Step into a door frame so that the outside of your right / left wrist can press against the door frame without your upper arm leaving your side.  Gently press your right / left wrist into the door frame, as if you were trying to swing the back of your hand away from your stomach. Gradually increase the tension, until you are pressing as hard as you can, without shrugging your shoulder or increasing any shoulder discomfort.  Hold for __________ seconds.  Release the tension slowly. Relax your shoulder muscles completely before you begin the next repetition. Repeat __________ times. Complete this exercise __________ times per day.  STRENGTH - Supraspinatus   Stand or sit with good posture. Grasp a __________ weight, or an exercise band or tubing, so that your hand is "thumbs-up," like you are shaking hands.  Slowly lift your right / left arm in a "V"  away from your thigh, diagonally into the space between your side and straight ahead. Lift your hand to shoulder height or as far as you can, without increasing any shoulder pain. At first, many people do not lift their hands above shoulder height.  Avoid shrugging your right / left shoulder as your arm rises, by keeping your shoulder blade tucked down and toward your mid-back spine.  Hold for __________ seconds. Control the descent of your hand, as you slowly return to your starting position. Repeat __________ times. Complete this exercise __________ times per day.  STRENGTH - External Rotators  Secure a rubber exercise band or tubing to a fixed object (table, pole) so that it is at the same height as your right /   left elbow when you are standing or sitting on a firm surface.  Stand or sit so that the secured exercise band is at your uninjured side.  Bend your right / left elbow 90 degrees. Place a folded towel or small pillow under your right / left arm, so that your elbow is a few inches away from your side.  Keeping the tension on the exercise band, pull it away from your body, as if pivoting on your elbow. Be sure to keep your body steady, so that the movement is coming only from your rotating shoulder.  Hold for __________ seconds. Release the tension in a controlled manner, as you return to the starting position. Repeat __________ times. Complete this exercise __________ times per day.  STRENGTH - Internal Rotators   Secure a rubber exercise band or tubing to a fixed object (table, pole) so that it is at the same height as your right / left elbow when you are standing or sitting on a firm surface.  Stand or sit so that the secured exercise band is at your right / left side.  Bend your elbow 90 degrees. Place a folded towel or small pillow under your right / left arm so that your elbow is a few inches away from your side.  Keeping the tension on the exercise band, pull it across your  body, toward your stomach. Be sure to keep your body steady, so that the movement is coming only from your rotating shoulder.  Hold for __________ seconds. Release the tension in a controlled manner, as you return to the starting position. Repeat __________ times. Complete this exercise __________ times per day.  STRENGTH - Scapular Protractors, Standing   Stand arms length away from a wall. Place your hands on the wall, keeping your elbows straight.  Begin by dropping your shoulder blades down and toward your mid-back spine.  To strengthen your protractors, keep your shoulder blades down, but slide them forward on your rib cage. It will feel as if you are lifting the back of your rib cage away from the wall. This is a subtle motion and can be challenging to complete. Ask your caregiver for further instruction, if you are not sure you are doing the exercise correctly.  Hold for __________ seconds. Slowly return to the starting position, resting the muscles completely before starting the next repetition. Repeat __________ times. Complete this exercise __________ times per day. STRENGTH - Scapular Protractors, Supine  Lie on your back on a firm surface. Extend your right / left arm straight into the air while holding a __________ weight in your hand.  Keeping your head and back in place, lift your shoulder off the floor.  Hold for __________ seconds. Slowly return to the starting position, and allow your muscles to relax completely before starting the next repetition. Repeat __________ times. Complete this exercise __________ times per day. STRENGTH - Scapular Protractors, Quadruped  Get onto your hands and knees, with your shoulders directly over your hands (or as close as you can be, comfortably).  Keeping your elbows locked, lift the back of your rib cage up into your shoulder blades, so your mid-back rounds out. Keep your neck muscles relaxed.  Hold this position for __________ seconds.  Slowly return to the starting position and allow your muscles to relax completely before starting the next repetition. Repeat __________ times. Complete this exercise __________ times per day.  STRENGTH - Scapular Retractors  Secure a rubber exercise band or tubing to a   fixed object (table, pole), so that it is at the height of your shoulders when you are either standing, or sitting on a firm armless chair.  With a palm down grip, grasp an end of the band in each hand. Straighten your elbows and lift your hands straight in front of you, at shoulder height. Step back, away from the secured end of the band, until it becomes tense.  Squeezing your shoulder blades together, draw your elbows back toward your sides, as you bend them. Keep your upper arms lifted away from your body throughout the exercise.  Hold for __________ seconds. Slowly ease the tension on the band, as you reverse the directions and return to the starting position. Repeat __________ times. Complete this exercise __________ times per day. STRENGTH - Shoulder Extensors   Secure a rubber exercise band or tubing to a fixed object (table, pole) so that it is at the height of your shoulders when you are either standing, or sitting on a firm armless chair.  With a thumbs-up grip, grasp an end of the band in each hand. Straighten your elbows and lift your hands straight in front of you, at shoulder height. Step back, away from the secured end of the band, until it becomes tense.  Squeezing your shoulder blades together, pull your hands down to the sides of your thighs. Do not allow your hands to go behind you.  Hold for __________ seconds. Slowly ease the tension on the band, as you reverse the directions and return to the starting position. Repeat __________ times. Complete this exercise __________ times per day.  STRENGTH - Scapular Retractors and External Rotators   Secure a rubber exercise band or tubing to a fixed object (table,  pole) so that it is at the height as your shoulders, when you are either standing, or sitting on a firm armless chair.  With a palm down grip, grasp an end of the band in each hand. Bend your elbows 90 degrees and lift your elbows to shoulder height, at your sides. Step back, away from the secured end of the band, until it becomes tense.  Squeezing your shoulder blades together, rotate your shoulders so that your upper arms and elbows remain stationary, but your fists travel upward to head height.  Hold for __________ seconds. Slowly ease the tension on the band, as you reverse the directions and return to the starting position. Repeat __________ times. Complete this exercise __________ times per day.  STRENGTH - Scapular Retractors and External Rotators, Rowing   Secure a rubber exercise band or tubing to a fixed object (table, pole) so that it is at the height of your shoulders, when you are either standing, or sitting on a firm armless chair.  With a palm down grip, grasp an end of the band in each hand. Straighten your elbows and lift your hands straight in front of you, at shoulder height. Step back, away from the secured end of the band, until it becomes tense.  Step 1: Squeeze your shoulder blades together. Bending your elbows, draw your hands to your chest, as if you are rowing a boat. At the end of this motion, your hands and elbow should be at shoulder height and your elbows should be out to your sides.  Step 2: Rotate your shoulders, to raise your hands above your head. Your forearms should be vertical and your upper arms should be horizontal.  Hold for __________ seconds. Slowly ease the tension on the band, as you  reverse the directions and return to the starting position. Repeat __________ times. Complete this exercise __________ times per day.  STRENGTH - Scapular Depressors  Find a sturdy chair without wheels, such as a dining room chair.  Keeping your feet on the floor, and  your hands on the chair arms, lift your bottom up from the seat, and lock your elbows.  Keeping your elbows straight, allow gravity to pull your body weight down. Your shoulders will rise toward your ears.  Raise your body against gravity by drawing your shoulder blades down your back, shortening the distance between your shoulders and ears. Although your feet should always maintain contact with the floor, your feet should progressively support less body weight, as you get stronger.  Hold for __________ seconds. In a controlled and slow manner, lower your body weight to begin the next repetition. Repeat __________ times. Complete this exercise __________ times per day.    This information is not intended to replace advice given to you by your health care provider. Make sure you discuss any questions you have with your health care provider.   Document Released: 06/20/2005 Document Revised: 07/11/2014 Document Reviewed: 10/02/2008 [employer] Patient Education [employer]

## 2015-10-06 NOTE — Progress Notes (Signed)
Joel James is a 55 y.o. male who presents to [facility] today for bilateral shoulder pain. Patient has a history of recurrent right shoulder pain. He's received subacromial bursa injections in his right shoulder several times. Most recently was in November 2016. He noted resumption of pain recently. Pain is worse with overhead motion and reaching back and at night. He has been doing some home exercise program. He would like a repeat injection if possible.  Additionally patient notes new left shoulder pain. Pain is consistent with previous episodes of right subacromial bursitis/impingement/tendinitis. Pain has been only present for a few days. Pain is worse with overhead motion and reaching back. Pain is bothersome at night. He denies any injury. He has tried ibuprofen for pain which has helped.   Past Medical History  Diagnosis Date  . Hypertension   . Hyperlipidemia    Past Surgical History  Procedure Laterality Date  . Appendectomy     Social History  Substance Use Topics  . Smoking status: Never Smoker   . Smokeless tobacco: Never Used  . Alcohol Use: Yes   family history includes Diabetes in his father; Heart attack in his father; Heart disease in his brother; Heart failure in his father; Hyperlipidemia in his father and mother; Hypertension in his father and mother.  ROS:  No headache, visual changes, nausea, vomiting, diarrhea, constipation, dizziness, abdominal pain, skin rash, fevers, chills, night sweats, weight loss, swollen lymph nodes, body aches, joint swelling, muscle aches, chest pain, shortness of breath, mood changes, visual or auditory hallucinations.    Medications: Current Outpatient Prescriptions  Medication Sig Dispense Refill  . aspirin 325 MG tablet Take 325 mg by mouth daily.    . meloxicam (MOBIC) 15 MG tablet One tab PO qAM with breakfast for 2 weeks, then daily prn pain. 30 tablet 3  .  Olmesartan-Amlodipine-HCTZ (TRIBENZOR) 40-10-25 MG TABS Take by mouth.    . OMEPRAZOLE PO Take by mouth.    Marland Kitchen SIMVASTATIN PO Take by mouth.    . temazepam (RESTORIL) 7.5 MG capsule Take 7.5 mg by mouth at bedtime as needed.     No current facility-administered medications for this visit.   Allergies  Allergen Reactions  . Codeine   . Penicillins   . Propoxyphene Hcl      Exam:  BP 143/84 mmHg  Pulse 73  Wt 176 lb (79.833 kg) General: Well Developed, well nourished, and in no acute distress.  Neuro/Psych: Alert and oriented x3, extra-ocular muscles intact, able to move all 4 extremities, sensation grossly intact. Skin: Warm and dry, no rashes noted.  Respiratory: Not using accessory muscles, speaking in full sentences, trachea midline.  Cardiovascular: Pulses palpable, no extremity edema. Abdomen: Does not appear distended. MSK:   Right shoulder: Normal-appearing nontender Normal motion however pain with abduction Positive Hawkins and Neer's test. Positive empty can test. Negative crossover arm compression test. Negative Yergason's and speeds test.  Left shoulder: Normal-appearing nontender Normal motion however pain with abduction. Positive Hawkins and Neer's test. Positive empty can test.  Negative crossover arm compression test. Positive speeds test. Negative Yergason's test.  Procedure: Real-time Ultrasound Guided Injection of Right subacromial bursa  Device: GE Logiq E  Images permanently stored and available for review in the ultrasound unit. Verbal informed consent obtained. Discussed risks and benefits of procedure. Warned about infection bleeding damage to structures skin hypopigmentation and fat atrophy among others. Patient expresses understanding and agreement Time-out conducted.  Noted no overlying  erythema, induration, or other signs of local infection.  Skin prepped in a sterile fashion.  Local anesthesia: Topical Ethyl chloride.  With sterile  technique and under real time ultrasound guidance: 3 mL of Marcaine and 40 mg of Kenalog injected easily.  Completed without difficulty  Of note calcifications and increased vascular activity seen within the distal insertional portion of the supraspinous tendon on ultrasound. No large tendon defect seen. Pain immediately resolved suggesting accurate placement of the medication.  Advised to call if fevers/chills, erythema, induration, drainage, or persistent bleeding.  Images permanently stored and available for review in the ultrasound unit.  Impression: Technically successful ultrasound guided injection.  Procedure: Real-time Ultrasound Guided Injection of left subacromial bursa  Device: GE Logiq E  Images permanently stored and available for review in the ultrasound unit. Verbal informed consent obtained. Discussed risks and benefits of procedure. Warned about infection bleeding damage to structures skin hypopigmentation and fat atrophy among others. Patient expresses understanding and agreement Time-out conducted.  Noted no overlying erythema, induration, or other signs of local infection.  Skin prepped in a sterile fashion.  Local anesthesia: Topical Ethyl chloride.  With sterile technique and under real time ultrasound guidance: 3 mL of Marcaine and 40 mg of Kenalog injected easily.  Completed without difficulty  Pain immediately resolved suggesting accurate placement of the medication.  Advised to call if fevers/chills, erythema, induration, drainage, or persistent bleeding.  Images permanently stored and available for review in the ultrasound unit.  Impression: Technically successful ultrasound guided injection.       No results found for this or any previous visit (from the past 24 hour(s)). No results found.   Please see individual assessment and plan sections.

## 2015-10-06 NOTE — Assessment & Plan Note (Signed)
Blood pressure mildly elevated. Follow-up with PCP.

## 2016-01-18 ENCOUNTER — Emergency Department (INDEPENDENT_AMBULATORY_CARE_PROVIDER_SITE_OTHER): Payer: BLUE CROSS/BLUE SHIELD

## 2016-01-18 ENCOUNTER — Emergency Department
Admission: EM | Admit: 2016-01-18 | Discharge: 2016-01-18 | Disposition: A | Discharge status: Home / Self Care | Payer: BLUE CROSS/BLUE SHIELD | Source: Home / Self Care | Attending: Family Medicine | Admitting: Family Medicine

## 2016-01-18 ENCOUNTER — Encounter: Payer: Self-pay | Admitting: Emergency Medicine

## 2016-01-18 DIAGNOSIS — S60221A Contusion of right hand, initial encounter: Secondary | ICD-10-CM

## 2016-01-18 DIAGNOSIS — M778 Other enthesopathies, not elsewhere classified: Secondary | ICD-10-CM

## 2016-01-18 DIAGNOSIS — M779 Enthesopathy, unspecified: Secondary | ICD-10-CM

## 2016-01-18 DIAGNOSIS — M79644 Pain in right finger(s): Secondary | ICD-10-CM | POA: Diagnosis not present

## 2016-01-18 MED ORDER — DICLOFENAC SODIUM 1 % TD GEL: TRANSDERMAL | Status: DC

## 2016-01-18 NOTE — Discharge Instructions (Signed)
Begin finger range of motion and stretching exercises.   Tendinitis and Tenosynovitis  Tendinitis is inflammation of the tendon. Tenosynovitis is inflammation of the lining around the tendon (tendon sheath). These painful conditions often occur at once. Tendons attach muscle to bone. To move a limb, force from the muscle moves through the tendon, to the bone. These conditions often cause increased pain when moving. Tendinitis may be caused by a small or partial tear in the tendon.  SYMPTOMS   Pain, tenderness, redness, bruising, or swelling at the injury.  Loss of normal joint movement.  Pain that gets worse with use of the muscle and joint attached to the tendon.  Weakness in the tendon, caused by calcium build up that may occur with tendinitis.  Commonly affected tendons:  Achilles tendon (calf of leg).  Rotator cuff (shoulder joint).  Patellar tendon (kneecap to shin).  Peroneal tendon (ankle).  Posterior tibial tendon (inner ankle).  Biceps tendon (in front of shoulder). CAUSES   Sudden strain on a flexed muscle, muscle overuse, sudden increase or change in activity, vigorous activity.  Result of a direct hit (less common).  Poor muscle action (biomechanics). RISK INCREASES WITH:  Injury (trauma).  Too much exercise.  Sudden change in athletic activity.  Incorrect exercise form or technique.  Poor strength and flexibility.  Not warming-up properly before activity.  Returning to activity before healing is complete. PREVENTION   Warm-up and stretch properly before activity.  Maintain physical fitness:  Joint flexibility.  Muscle strength and endurance.  Fitness that increases heart rate.  Learn and use proper exercise techniques.  Use rehabilitation exercises to strengthen weak muscles and tendons.  Ice the tendon after activity, to reduce recurring inflammation.  Wear proper fitting protective equipment for specific tendons, when  indicated. PROGNOSIS  When treated properly, can be cured in 6 to 8 weeks. Recovery may take longer, depending on degree of injury.  RELATED COMPLICATIONS   Re-injury or recurring symptoms.  Permanent weakness or joint stiffness, if injury is severe and recovery is not completed.  Delayed healing, if sports are started before healing is complete.  Tearing apart (rupture) of the inflamed tendon. Tendinitis means the tendon is injured and must recover. TREATMENT  Treatment first involves ice, medicine, and rest from aggravating activities. This reduces pain and inflammation. Modifying your activity may be considered to prevent recurring injury. A brace, elastic bandage wrap, splint, cast, or sling may be prescribed to protect the joint for a short period. After that period, strengthening and stretching exercise may help to regain strength and full range of motion. If the condition persists, despite non-surgical treatment, surgery may be recommended to remove the inflamed tendon lining. Corticosteroid injections may be given to reduce inflammation. However, these injections may weaken the tendon and increase your risk for tendon rupture. MEDICATION   If pain medicine is needed, nonsteroidal anti-inflammatory medicines (aspirin and ibuprofen), or other minor pain relievers (acetaminophen), are often recommended.  Do not take pain medicine for 7 days before surgery.  Prescription pain relievers are usually prescribed only after surgery. Use only as directed and only as much as you need.  Ointments applied to the skin may be helpful.  Corticosteroid injections may be given to reduce inflammation. However, this may increase your risk of a tendon rupture. HEAT AND COLD  Cold treatment (icing) relieves pain and reduces inflammation. Cold treatment should be applied for 10 to 15 minutes every 2 to 3 hours, and immediately after activity that aggravates your  symptoms. Use ice packs or an ice  massage.  Heat treatment may be used before performing stretching and strengthening activities prescribed by your caregiver, physical therapist, or athletic trainer. Use a heat pack or a warm water soak. SEEK MEDICAL CARE IF:   Symptoms get worse or do not improve, despite treatment.  Pain becomes too much to tolerate.  You develop numbness or tingling.  Toes become cold, or toenails become blue, gray, or dark colored.  New, unexplained symptoms develop. (Drugs used in treatment may produce side effects.)   This information is not intended to replace advice given to you by your health care provider. Make sure you discuss any questions you have with your health care provider.   Document Released: 06/20/2005 Document Revised: 09/12/2011 Document Reviewed: 10/02/2008 Elsevier Interactive Patient Education [employer]

## 2016-01-18 NOTE — ED Provider Notes (Signed)
CSN: [id number]     Arrival date & time 01/18/16  1100 History   First MD Initiated Contact with Patient 01/18/16 1128     Chief Complaint  Patient presents with  . Hand Pain    right index first knuckle     HPI Comments: Two weeks ago while working on a pool pump, patient's adjustable wrench slipped and struck the dorsal surface of his right second finger MCP joint.  He had mild swelling initially.  He now has persistent pain on the dorsal aspect of the joint, and mild decrease in grip.     Patient is a 55 y.o. male presenting with hand injury. The history is provided by the patient.  Hand Injury Location:  Hand Time since incident:  2 weeks Injury: yes   Mechanism of injury comment:  Contusion by wrench Hand location:  R hand Pain details:    Quality:  Aching   Radiates to:  Does not radiate   Severity:  Mild   Onset quality:  Sudden   Duration:  2 weeks   Timing:  Constant   Progression:  Unchanged Chronicity:  New Dislocation: no   Prior injury to area:  No Relieved by:  None tried Worsened by:  Movement Ineffective treatments:  None tried Associated symptoms: no decreased range of motion, no numbness, no stiffness, no swelling and no tingling     Past Medical History  Diagnosis Date  . Hypertension   . Hyperlipidemia    Past Surgical History  Procedure Laterality Date  . Appendectomy     Family History  Problem Relation Age of Onset  . Heart failure Father   . Hypertension Father   . Diabetes Father   . Hyperlipidemia Father   . Heart attack Father   . Hyperlipidemia Mother   . Hypertension Mother   . Heart disease Brother    Social History  Substance Use Topics  . Smoking status: Never Smoker   . Smokeless tobacco: Never Used  . Alcohol Use: Yes    Review of Systems  Musculoskeletal: Negative for stiffness.  All other systems reviewed and are negative.   Allergies  Codeine; Penicillins; and Propoxyphene hcl  Home Medications   Prior to  Admission medications   Medication Sig Start Date End Date Taking? Authorizing Provider  Pitavastatin Calcium (LIVALO) 1 MG TABS Take by mouth.   Yes Historical Provider, MD  aspirin 325 MG tablet Take 325 mg by mouth daily.    Historical Provider, MD  diclofenac sodium (VOLTAREN) 1 % GEL Apply 1 to 2 gm each application, 2 to 3 times daily 01/18/16   Lattie Haw, MD  meloxicam (MOBIC) 15 MG tablet One tab PO qAM with breakfast for 2 weeks, then daily prn pain. 10/01/12   Monica Becton, MD  Olmesartan-Amlodipine-HCTZ [facility]) 40-10-25 MG TABS Take by mouth.    Historical Provider, MD  OMEPRAZOLE PO Take by mouth.    Historical Provider, MD  SIMVASTATIN PO Take by mouth.    Historical Provider, MD  temazepam (RESTORIL) 7.5 MG capsule Take 7.5 mg by mouth at bedtime as needed.    Historical Provider, MD   Meds Ordered and Administered this Visit  Medications - No data to display  BP 125/79 mmHg  Pulse 66  Temp(Src) 98.3 F (36.8 C) (Oral)  Resp 16  Ht  (1.803 m)  Wt 170 lb (77.111 kg)  BMI 23.72 kg/m2  SpO2 100% No data found.   Physical Exam  Constitutional: He is oriented to person, place, and time. He appears well-developed and well-nourished. No distress.  HENT:  Head: Atraumatic.  Eyes: Pupils are equal, round, and reactive to light.  Musculoskeletal:       Right hand: He exhibits tenderness and bony tenderness. He exhibits normal range of motion, normal two-point discrimination, normal capillary refill, no deformity, no laceration and no swelling. Normal sensation noted. Normal strength noted.       Hands: There is distinct tenderness to palpation over the extensor tendon overlying the right second MCP joint. Joint stable.  Neurological: He is alert and oriented to person, place, and time.  Skin: Skin is warm and dry.  Nursing note and vitals reviewed.   ED Course  Procedures none   Imaging Review Dg Hand Complete Right  01/18/2016  CLINICAL DATA:   Patient hit hand on solid object 2 weeks prior. Pain primarily second digit EXAM: RIGHT HAND - COMPLETE 3+ VIEW COMPARISON:  None. FINDINGS: Frontal, oblique, and lateral views obtained. There is no demonstrable fracture or dislocation. The joint spaces appear normal. No erosive change. IMPRESSION: Negative. Electronically Signed   By: Bretta BangWilliam  Woodruff III M.D.   On: 01/18/2016 11:57     MDM   1. Tendonitis of finger   2. Contusion, hand, right, initial encounter    Begin Voltaren Gel BID to TID.  Begin finger range of motion and stretching exercises. Followup with Dr. Rodney Langtonhomas Thekkekandam or Dr. Clementeen GrahamEvan Corey ([facility]) if not improving about two weeks.        Lattie HawStephen A Manuelito Poage, MD 01/18/16 [phone]

## 2016-01-18 NOTE — ED Notes (Signed)
Patient hit first knuckle of right index finger 2 weeks ago and it is still painful with certain motions.

## 2016-01-28 ENCOUNTER — Emergency Department
Admission: EM | Admit: 2016-01-28 | Discharge: 2016-01-28 | Discharge status: Absent without leave | Payer: BLUE CROSS/BLUE SHIELD | Source: Home / Self Care

## 2016-03-21 ENCOUNTER — Encounter: Payer: Self-pay | Admitting: Family Medicine

## 2016-03-21 ENCOUNTER — Ambulatory Visit (INDEPENDENT_AMBULATORY_CARE_PROVIDER_SITE_OTHER): Payer: BLUE CROSS/BLUE SHIELD | Admitting: Family Medicine

## 2016-03-21 VITALS — BP 133/78 | HR 73 | Wt 177.0 lb

## 2016-03-21 DIAGNOSIS — M75101 Unspecified rotator cuff tear or rupture of right shoulder, not specified as traumatic: Secondary | ICD-10-CM | POA: Diagnosis not present

## 2016-03-21 NOTE — Patient Instructions (Signed)
Thank you for coming in today. Call or go to the ER if you develop a large red swollen joint with extreme pain or oozing puss.  Continue home exercises.  Return as needed.    Impingement Syndrome, Rotator Cuff, Bursitis With Rehab Impingement syndrome is a condition that involves inflammation of the tendons of the rotator cuff and the subacromial bursa, that causes pain in the shoulder. The rotator cuff consists of four tendons and muscles that control much of the shoulder and upper arm function. The subacromial bursa is a fluid filled sac that helps reduce friction between the rotator cuff and one of the bones of the shoulder (acromion). Impingement syndrome is usually an overuse injury that causes swelling of the bursa (bursitis), swelling of the tendon (tendonitis), and/or a tear of the tendon (strain). Strains are classified into three categories. Grade 1 strains cause pain, but the tendon is not lengthened. Grade 2 strains include a lengthened ligament, due to the ligament being stretched or partially ruptured. With grade 2 strains there is still function, although the function may be decreased. Grade 3 strains include a complete tear of the tendon or muscle, and function is usually impaired. SYMPTOMS   Pain around the shoulder, often at the outer portion of the upper arm.  Pain that gets worse with shoulder function, especially when reaching overhead or lifting.  Sometimes, aching when not using the arm.  Pain that wakes you up at night.  Sometimes, tenderness, swelling, warmth, or redness over the affected area.  Loss of strength.  Limited motion of the shoulder, especially reaching behind the back (to the back pocket or to unhook bra) or across your body.  Crackling sound (crepitation) when moving the arm.  Biceps tendon pain and inflammation (in the front of the shoulder). Worse when bending the elbow or lifting. CAUSES  Impingement syndrome is often an overuse injury, in which  chronic (repetitive) motions cause the tendons or bursa to become inflamed. A strain occurs when a force is paced on the tendon or muscle that is greater than it can withstand. Common mechanisms of injury include: Stress from sudden increase in duration, frequency, or intensity of training.  Direct hit (trauma) to the shoulder.  Aging, erosion of the tendon with normal use.  Bony bump on shoulder (acromial spur). RISK INCREASES WITH:  Contact sports (football, wrestling, boxing).  Throwing sports (baseball, tennis, volleyball).  Weightlifting and bodybuilding.  Heavy labor.  Previous injury to the rotator cuff, including impingement.  Poor shoulder strength and flexibility.  Failure to warm up properly before activity.  Inadequate protective equipment.  Old age.  Bony bump on shoulder (acromial spur). PREVENTION   Warm up and stretch properly before activity.  Allow for adequate recovery between workouts.  Maintain physical fitness:  Strength, flexibility, and endurance.  Cardiovascular fitness.  Learn and use proper exercise technique. PROGNOSIS  If treated properly, impingement syndrome usually goes away within 6 weeks. Sometimes surgery is required.  RELATED COMPLICATIONS   Longer healing time if not properly treated, or if not given enough time to heal.  Recurring symptoms, that result in a chronic condition.  Shoulder stiffness, frozen shoulder, or loss of motion.  Rotator cuff tendon tear.  Recurring symptoms, especially if activity is resumed too soon, with overuse, with a direct blow, or when using poor technique. TREATMENT  Treatment first involves the use of ice and medicine, to reduce pain and inflammation. The use of strengthening and stretching exercises may help reduce pain with  activity. These exercises may be performed at home or with a therapist. If non-surgical treatment is unsuccessful after more than 6 months, surgery may be advised. After  surgery and rehabilitation, activity is usually possible in 3 months.  MEDICATION  If pain medicine is needed, nonsteroidal anti-inflammatory medicines (aspirin and ibuprofen), or other minor pain relievers (acetaminophen), are often advised.  Do not take pain medicine for 7 days before surgery.  Prescription pain relievers may be given, if your caregiver thinks they are needed. Use only as directed and only as much as you need.  Corticosteroid injections may be given by your caregiver. These injections should be reserved for the most serious cases, because they may only be given a certain number of times. HEAT AND COLD  Cold treatment (icing) should be applied for 10 to 15 minutes every 2 to 3 hours for inflammation and pain, and immediately after activity that aggravates your symptoms. Use ice packs or an ice massage.  Heat treatment may be used before performing stretching and strengthening activities prescribed by your caregiver, physical therapist, or athletic trainer. Use a heat pack or a warm water soak. SEEK MEDICAL CARE IF:   Symptoms get worse or do not improve in 4 to 6 weeks, despite treatment.  New, unexplained symptoms develop. (Drugs used in treatment may produce side effects.) EXERCISES  RANGE OF MOTION (ROM) AND STRETCHING EXERCISES - Impingement Syndrome (Rotator Cuff  Tendinitis, Bursitis) These exercises may help you when beginning to rehabilitate your injury. Your symptoms may go away with or without further involvement from your physician, physical therapist or athletic trainer. While completing these exercises, remember:   Restoring tissue flexibility helps normal motion to return to the joints. This allows healthier, less painful movement and activity.  An effective stretch should be held for at least 30 seconds.  A stretch should never be painful. You should only feel a gentle lengthening or release in the stretched tissue. STRETCH - Flexion, Standing  Stand  with good posture. With an underhand grip on your right / left hand, and an overhand grip on the opposite hand, grasp a broomstick or cane so that your hands are a little more than shoulder width apart.  Keeping your right / left elbow straight and shoulder muscles relaxed, push the stick with your opposite hand, to raise your right / left arm in front of your body and then overhead. Raise your arm until you feel a stretch in your right / left shoulder, but before you have increased shoulder pain.  Try to avoid shrugging your right / left shoulder as your arm rises, by keeping your shoulder blade tucked down and toward your mid-back spine. Hold for __________ seconds.  Slowly return to the starting position. Repeat __________ times. Complete this exercise __________ times per day. STRETCH - Abduction, Supine  Lie on your back. With an underhand grip on your right / left hand and an overhand grip on the opposite hand, grasp a broomstick or cane so that your hands are a little more than shoulder width apart.  Keeping your right / left elbow straight and your shoulder muscles relaxed, push the stick with your opposite hand, to raise your right / left arm out to the side of your body and then overhead. Raise your arm until you feel a stretch in your right / left shoulder, but before you have increased shoulder pain.  Try to avoid shrugging your right / left shoulder as your arm rises, by keeping your shoulder  blade tucked down and toward your mid-back spine. Hold for __________ seconds.  Slowly return to the starting position. Repeat __________ times. Complete this exercise __________ times per day. ROM - Flexion, Active-Assisted  Lie on your back. You may bend your knees for comfort.  Grasp a broomstick or cane so your hands are about shoulder width apart. Your right / left hand should grip the end of the stick, so that your hand is positioned "thumbs-up," as if you were about to shake  hands.  Using your healthy arm to lead, raise your right / left arm overhead, until you feel a gentle stretch in your shoulder. Hold for __________ seconds.  Use the stick to assist in returning your right / left arm to its starting position. Repeat __________ times. Complete this exercise __________ times per day.  ROM - Internal Rotation, Supine   Lie on your back on a firm surface. Place your right / left elbow about 60 degrees away from your side. Elevate your elbow with a folded towel, so that the elbow and shoulder are the same height.  Using a broomstick or cane and your strong arm, pull your right / left hand toward your body until you feel a gentle stretch, but no increase in your shoulder pain. Keep your shoulder and elbow in place throughout the exercise.  Hold for __________ seconds. Slowly return to the starting position. Repeat __________ times. Complete this exercise __________ times per day. STRETCH - Internal Rotation  Place your right / left hand behind your back, palm up.  Throw a towel or belt over your opposite shoulder. Grasp the towel with your right / left hand.  While keeping an upright posture, gently pull up on the towel, until you feel a stretch in the front of your right / left shoulder.  Avoid shrugging your right / left shoulder as your arm rises, by keeping your shoulder blade tucked down and toward your mid-back spine.  Hold for __________ seconds. Release the stretch, by lowering your healthy hand. Repeat __________ times. Complete this exercise __________ times per day. ROM - Internal Rotation   Using an underhand grip, grasp a stick behind your back with both hands.  While standing upright with good posture, slide the stick up your back until you feel a mild stretch in the front of your shoulder.  Hold for __________ seconds. Slowly return to your starting position. Repeat __________ times. Complete this exercise __________ times per day.  STRETCH  - Posterior Shoulder Capsule   Stand or sit with good posture. Grasp your right / left elbow and draw it across your chest, keeping it at the same height as your shoulder.  Pull your elbow, so your upper arm comes in closer to your chest. Pull until you feel a gentle stretch in the back of your shoulder.  Hold for __________ seconds. Repeat __________ times. Complete this exercise __________ times per day. STRENGTHENING EXERCISES - Impingement Syndrome (Rotator Cuff Tendinitis, Bursitis) These exercises may help you when beginning to rehabilitate your injury. They may resolve your symptoms with or without further involvement from your physician, physical therapist or athletic trainer. While completing these exercises, remember:  Muscles can gain both the endurance and the strength needed for everyday activities through controlled exercises.  Complete these exercises as instructed by your physician, physical therapist or athletic trainer. Increase the resistance and repetitions only as guided.  You may experience muscle soreness or fatigue, but the pain or discomfort you are trying to  eliminate should never worsen during these exercises. If this pain does get worse, stop and make sure you are following the directions exactly. If the pain is still present after adjustments, discontinue the exercise until you can discuss the trouble with your clinician.  During your recovery, avoid activity or exercises which involve actions that place your injured hand or elbow above your head or behind your back or head. These positions stress the tissues which you are trying to heal. STRENGTH - Scapular Depression and Adduction   With good posture, sit on a firm chair. Support your arms in front of you, with pillows, arm rests, or on a table top. Have your elbows in line with the sides of your body.  Gently draw your shoulder blades down and toward your mid-back spine. Gradually increase the tension, without  tensing the muscles along the top of your shoulders and the back of your neck.  Hold for __________ seconds. Slowly release the tension and relax your muscles completely before starting the next repetition.  After you have practiced this exercise, remove the arm support and complete the exercise in standing as well as sitting position. Repeat __________ times. Complete this exercise __________ times per day.  STRENGTH - Shoulder Abductors, Isometric  With good posture, stand or sit about 4-6 inches from a wall, with your right / left side facing the wall.  Bend your right / left elbow. Gently press your right / left elbow into the wall. Increase the pressure gradually, until you are pressing as hard as you can, without shrugging your shoulder or increasing any shoulder discomfort.  Hold for __________ seconds.  Release the tension slowly. Relax your shoulder muscles completely before you begin the next repetition. Repeat __________ times. Complete this exercise __________ times per day.  STRENGTH - External Rotators, Isometric  Keep your right / left elbow at your side and bend it 90 degrees.  Step into a door frame so that the outside of your right / left wrist can press against the door frame without your upper arm leaving your side.  Gently press your right / left wrist into the door frame, as if you were trying to swing the back of your hand away from your stomach. Gradually increase the tension, until you are pressing as hard as you can, without shrugging your shoulder or increasing any shoulder discomfort.  Hold for __________ seconds.  Release the tension slowly. Relax your shoulder muscles completely before you begin the next repetition. Repeat __________ times. Complete this exercise __________ times per day.  STRENGTH - Supraspinatus   Stand or sit with good posture. Grasp a __________ weight, or an exercise band or tubing, so that your hand is "thumbs-up," like you are  shaking hands.  Slowly lift your right / left arm in a "V" away from your thigh, diagonally into the space between your side and straight ahead. Lift your hand to shoulder height or as far as you can, without increasing any shoulder pain. At first, many people do not lift their hands above shoulder height.  Avoid shrugging your right / left shoulder as your arm rises, by keeping your shoulder blade tucked down and toward your mid-back spine.  Hold for __________ seconds. Control the descent of your hand, as you slowly return to your starting position. Repeat __________ times. Complete this exercise __________ times per day.  STRENGTH - External Rotators  Secure a rubber exercise band or tubing to a fixed object (table, pole) so that it  is at the same height as your right / left elbow when you are standing or sitting on a firm surface.  Stand or sit so that the secured exercise band is at your uninjured side.  Bend your right / left elbow 90 degrees. Place a folded towel or small pillow under your right / left arm, so that your elbow is a few inches away from your side.  Keeping the tension on the exercise band, pull it away from your body, as if pivoting on your elbow. Be sure to keep your body steady, so that the movement is coming only from your rotating shoulder.  Hold for __________ seconds. Release the tension in a controlled manner, as you return to the starting position. Repeat __________ times. Complete this exercise __________ times per day.  STRENGTH - Internal Rotators   Secure a rubber exercise band or tubing to a fixed object (table, pole) so that it is at the same height as your right / left elbow when you are standing or sitting on a firm surface.  Stand or sit so that the secured exercise band is at your right / left side.  Bend your elbow 90 degrees. Place a folded towel or small pillow under your right / left arm so that your elbow is a few inches away from your  side.  Keeping the tension on the exercise band, pull it across your body, toward your stomach. Be sure to keep your body steady, so that the movement is coming only from your rotating shoulder.  Hold for __________ seconds. Release the tension in a controlled manner, as you return to the starting position. Repeat __________ times. Complete this exercise __________ times per day.  STRENGTH - Scapular Protractors, Standing   Stand arms length away from a wall. Place your hands on the wall, keeping your elbows straight.  Begin by dropping your shoulder blades down and toward your mid-back spine.  To strengthen your protractors, keep your shoulder blades down, but slide them forward on your rib cage. It will feel as if you are lifting the back of your rib cage away from the wall. This is a subtle motion and can be challenging to complete. Ask your caregiver for further instruction, if you are not sure you are doing the exercise correctly.  Hold for __________ seconds. Slowly return to the starting position, resting the muscles completely before starting the next repetition. Repeat __________ times. Complete this exercise __________ times per day. STRENGTH - Scapular Protractors, Supine  Lie on your back on a firm surface. Extend your right / left arm straight into the air while holding a __________ weight in your hand.  Keeping your head and back in place, lift your shoulder off the floor.  Hold for __________ seconds. Slowly return to the starting position, and allow your muscles to relax completely before starting the next repetition. Repeat __________ times. Complete this exercise __________ times per day. STRENGTH - Scapular Protractors, Quadruped  Get onto your hands and knees, with your shoulders directly over your hands (or as close as you can be, comfortably).  Keeping your elbows locked, lift the back of your rib cage up into your shoulder blades, so your mid-back rounds out. Keep  your neck muscles relaxed.  Hold this position for __________ seconds. Slowly return to the starting position and allow your muscles to relax completely before starting the next repetition. Repeat __________ times. Complete this exercise __________ times per day.  STRENGTH - Scapular Retractors  Secure a rubber exercise band or tubing to a fixed object (table, pole), so that it is at the height of your shoulders when you are either standing, or sitting on a firm armless chair.  With a palm down grip, grasp an end of the band in each hand. Straighten your elbows and lift your hands straight in front of you, at shoulder height. Step back, away from the secured end of the band, until it becomes tense.  Squeezing your shoulder blades together, draw your elbows back toward your sides, as you bend them. Keep your upper arms lifted away from your body throughout the exercise.  Hold for __________ seconds. Slowly ease the tension on the band, as you reverse the directions and return to the starting position. Repeat __________ times. Complete this exercise __________ times per day. STRENGTH - Shoulder Extensors   Secure a rubber exercise band or tubing to a fixed object (table, pole) so that it is at the height of your shoulders when you are either standing, or sitting on a firm armless chair.  With a thumbs-up grip, grasp an end of the band in each hand. Straighten your elbows and lift your hands straight in front of you, at shoulder height. Step back, away from the secured end of the band, until it becomes tense.  Squeezing your shoulder blades together, pull your hands down to the sides of your thighs. Do not allow your hands to go behind you.  Hold for __________ seconds. Slowly ease the tension on the band, as you reverse the directions and return to the starting position. Repeat __________ times. Complete this exercise __________ times per day.  STRENGTH - Scapular Retractors and External  Rotators   Secure a rubber exercise band or tubing to a fixed object (table, pole) so that it is at the height as your shoulders, when you are either standing, or sitting on a firm armless chair.  With a palm down grip, grasp an end of the band in each hand. Bend your elbows 90 degrees and lift your elbows to shoulder height, at your sides. Step back, away from the secured end of the band, until it becomes tense.  Squeezing your shoulder blades together, rotate your shoulders so that your upper arms and elbows remain stationary, but your fists travel upward to head height.  Hold for __________ seconds. Slowly ease the tension on the band, as you reverse the directions and return to the starting position. Repeat __________ times. Complete this exercise __________ times per day.  STRENGTH - Scapular Retractors and External Rotators, Rowing   Secure a rubber exercise band or tubing to a fixed object (table, pole) so that it is at the height of your shoulders, when you are either standing, or sitting on a firm armless chair.  With a palm down grip, grasp an end of the band in each hand. Straighten your elbows and lift your hands straight in front of you, at shoulder height. Step back, away from the secured end of the band, until it becomes tense.  Step 1: Squeeze your shoulder blades together. Bending your elbows, draw your hands to your chest, as if you are rowing a boat. At the end of this motion, your hands and elbow should be at shoulder height and your elbows should be out to your sides.  Step 2: Rotate your shoulders, to raise your hands above your head. Your forearms should be vertical and your upper arms should be horizontal.  Hold for __________ seconds.  Slowly ease the tension on the band, as you reverse the directions and return to the starting position. Repeat __________ times. Complete this exercise __________ times per day.  STRENGTH - Scapular Depressors  Find a sturdy chair  without wheels, such as a dining room chair.  Keeping your feet on the floor, and your hands on the chair arms, lift your bottom up from the seat, and lock your elbows.  Keeping your elbows straight, allow gravity to pull your body weight down. Your shoulders will rise toward your ears.  Raise your body against gravity by drawing your shoulder blades down your back, shortening the distance between your shoulders and ears. Although your feet should always maintain contact with the floor, your feet should progressively support less body weight, as you get stronger.  Hold for __________ seconds. In a controlled and slow manner, lower your body weight to begin the next repetition. Repeat __________ times. Complete this exercise __________ times per day.    This information is not intended to replace advice given to you by your health care provider. Make sure you discuss any questions you have with your health care provider.   Document Released: 06/20/2005 Document Revised: 07/11/2014 Document Reviewed: 10/02/2008 [employer] Interactive Patient Education [employer].

## 2016-03-21 NOTE — Progress Notes (Signed)
Joel James is a 55 y.o. male who [name] today for follow up of right shoulder pain.  Patient has a history of recurrent right shoulder pain for years.  He has received several subacromial injections over the last few years, with the most recent being in April 2017.  His pain recurred 10 days ago.  He denies known injury.  His pain is worse with overhead motion and bothers him at night while he tries to sleep.  He has been doing home exercises 2 days a week.  Patient feels his pain is consistent with previous episodes of rotator cuff tendonitis.     Past Medical History:  Diagnosis Date  . Hyperlipidemia   . Hypertension    Past Surgical History:  Procedure Laterality Date  . APPENDECTOMY     Social History  Substance Use Topics  . Smoking status: Never Smoker  . Smokeless tobacco: Never Used  . Alcohol use Yes   family history includes Diabetes in his father; Heart attack in his father; Heart disease in his brother; Heart failure in his father; Hyperlipidemia in his father and mother; Hypertension in his father and mother.  ROS as above:  Medications: Current Outpatient Prescriptions  Medication Sig Dispense Refill  . aspirin 325 MG tablet Take 325 mg by mouth daily.    . diclofenac sodium (VOLTAREN) 1 % GEL Apply 1 to 2 gm each application, 2 to 3 times daily 100 g 1  . meloxicam (MOBIC) 15 MG tablet One tab PO qAM with breakfast for 2 weeks, then daily prn pain. 30 tablet 3  . Olmesartan-Amlodipine-HCTZ (TRIBENZOR) 40-10-25 MG TABS Take by mouth.    . OMEPRAZOLE PO Take by mouth.    . Pitavastatin Calcium (LIVALO) 1 MG TABS Take by mouth.    Marland Kitchen. SIMVASTATIN PO Take by mouth.    . temazepam (RESTORIL) 7.5 MG capsule Take 7.5 mg by mouth at bedtime as needed.     No current facility-administered medications for this visit.    Allergies    Allergen Reactions  . Codeine   . Penicillins   . Propoxyphene Hcl      Exam:  BP 133/78   Pulse 73   Wt 177 lb (80.3 kg)   BMI 24.69 kg/m  Gen: Well NAD Right shoulder:  No obvious effusion or bony deformity Normal range of motion No bony tenderness Pain with overhead motion of right arm  Procedure: Real-time Ultrasound Guided Injection of Right subacromial bursa  Device: GE Logiq E  Images permanently stored and available for review in the ultrasound unit. Verbal informed consent obtained. Discussed risks and benefits of procedure. Warned about infection bleeding damage to structures skin hypopigmentation and fat atrophy among others. Patient expresses understanding and agreement Time-out conducted.  Noted no overlying erythema, induration, or other signs of local infection.  Skin prepped in a sterile fashion.  Local anesthesia: Topical Ethyl chloride.  With sterile technique and under real time ultrasound guidance: 40 mg of Kenalog and 3 mL of Marcaine injected easily.  Completed without difficulty  Pain immediately resolved suggesting accurate placement of the medication.  Advised to call if fevers/chills, erythema, induration, drainage, or persistent bleeding.  Images permanently stored and available for review in the ultrasound unit.  Impression: Technically successful ultrasound guided injection.   Lot number: Marcaine: [id number] Kenalog: AAP [id number]  Assessment and Plan: 55 y.o. male with recurrent right shoulder pain likely  from rotator cuff tendonitis.  Patient was given a subacromial steroid injection.  Will continue with home exercise program.  He can follow up as needed for pain for another injection or referral to an orthopedist.     No orders of the defined types were placed in this encounter.   Discussed warning signs or symptoms. Please see discharge instructions. Patient expresses understanding.   I independently interviewed and examined  the patient. I was actively present and helped to create this note.  Clementeen Graham, MD

## 2016-04-28 ENCOUNTER — Emergency Department
Admission: EM | Admit: 2016-04-28 | Discharge: 2016-04-28 | Disposition: A | Discharge status: Home / Self Care | Payer: BLUE CROSS/BLUE SHIELD | Source: Home / Self Care | Attending: Family Medicine | Admitting: Family Medicine

## 2016-04-28 ENCOUNTER — Encounter: Payer: Self-pay | Admitting: *Deleted

## 2016-04-28 DIAGNOSIS — M5441 Lumbago with sciatica, right side: Secondary | ICD-10-CM | POA: Diagnosis not present

## 2016-04-28 MED ORDER — KETOROLAC TROMETHAMINE 60 MG/2ML IM SOLN
30.0000 mg | Freq: Once | INTRAMUSCULAR | Status: AC
  Administered 2016-04-28: 30 mg via INTRAMUSCULAR

## 2016-04-28 MED ORDER — PREDNISONE 20 MG PO TABS: ORAL_TABLET | ORAL | 0 refills | Status: DC

## 2016-04-28 MED ORDER — TRAMADOL HCL 50 MG PO TABS: 50.0000 mg | ORAL_TABLET | Freq: Four times a day (QID) | ORAL | 0 refills | Status: DC | PRN

## 2016-04-28 MED ORDER — CYCLOBENZAPRINE HCL 10 MG PO TABS: 10.0000 mg | ORAL_TABLET | Freq: Two times a day (BID) | ORAL | 0 refills | Status: DC | PRN

## 2016-04-28 MED ORDER — METHYLPREDNISOLONE SODIUM SUCC 40 MG IJ SOLR
80.0000 mg | Freq: Once | INTRAMUSCULAR | Status: AC
Start: 2016-04-28 — End: 2016-04-28
  Administered 2016-04-28: 80 mg via INTRAMUSCULAR

## 2016-04-28 NOTE — Discharge Instructions (Signed)
°  Tramadol is strong pain medication. While taking, do not drink alcohol, drive, or perform any other activities that requires focus while taking these medications.   Flexeril is a muscle relaxer and may cause drowsiness. Do not drink alcohol, drive, or operate heavy machinery while taking.  You were given a shot of solumedrol (a steroid) today to help with muscle pain and swelling.  You have been prescribed prednisone, an oral steroid.  You may start this medication tomorrow with breakfast.    You may start taking your Meloxicam as prescribed or continue taking ibuprofen but do not take both at the same time.  Meloxicam (Mobic) is an antiinflammatory to help with pain and inflammation.  Do not take ibuprofen, Advil, Aleve, or any other medications that contain NSAIDs while taking meloxicam as this may cause stomach upset or even ulcers if taken in large amounts for an extended period of time.

## 2016-04-28 NOTE — ED Triage Notes (Signed)
Patient c/o 2-3 days of low back pain radiating down right leg without injury. Used IBF and heat without relief.

## 2016-04-28 NOTE — ED Provider Notes (Signed)
CSN: [id number]     Arrival date & time 04/28/16  [id number] History   First MD Initiated Contact with Patient 04/28/16 [phone]     Chief Complaint  Patient presents with  . Back Pain   (Consider location/radiation/quality/duration/timing/severity/associated sxs/prior Treatment) HPI  Joel James is a 55 y.o. male presenting to UC with c/o bilateral lower back pain that started about 2-3 days ago.  Pain is aching and sharp, radiating down Right leg on occasion. Pain stops at this knee.  Pain is 4/10 at this time. Worse with certain movements and positions.  He has taken ibuprofen this morning with minimal relief.  He notes he has had prednisone in the past for shoulder pain, which helped significantly.  He also reports having flexeril and meloxicam at home but states the flexeril is about 56 year old.  He has not taken the meloxicam yet for current back pain.  He does not recall anything that could have caused the pain. Denies heavy lifting or falls. Denies prior hx of similar pain.  Denies numbness or tingling in leg or groin. No change in bowel or bladder habits.     Past Medical History:  Diagnosis Date  . Hyperlipidemia   . Hypertension    Past Surgical History:  Procedure Laterality Date  . APPENDECTOMY     Family History  Problem Relation Age of Onset  . Heart failure Father   . Hypertension Father   . Diabetes Father   . Hyperlipidemia Father   . Heart attack Father   . Hyperlipidemia Mother   . Hypertension Mother   . Heart disease Brother    Social History  Substance Use Topics  . Smoking status: Never Smoker  . Smokeless tobacco: Never Used  . Alcohol use Yes    Review of Systems  Constitutional: Negative for chills and fever.  Genitourinary: Negative for dysuria, flank pain, frequency and hematuria.  Musculoskeletal: Positive for back pain and myalgias. Negative for arthralgias, gait problem and joint swelling.  Skin: Negative for color change and rash.    Allergies   Codeine; Penicillins; and Propoxyphene hcl  Home Medications   Prior to Admission medications   Medication Sig Start Date End Date Taking? Authorizing Provider  aspirin 325 MG tablet Take 325 mg by mouth daily.    Historical Provider, MD  cyclobenzaprine (FLEXERIL) 10 MG tablet Take 1 tablet (10 mg total) by mouth 2 (two) times daily as needed. 04/28/16   Junius FinnerErin O'Malley, PA-C  diclofenac sodium (VOLTAREN) 1 % GEL Apply 1 to 2 gm each application, 2 to 3 times daily 01/18/16   Lattie HawStephen A Beese, MD  meloxicam (MOBIC) 15 MG tablet One tab PO qAM with breakfast for 2 weeks, then daily prn pain. 10/01/12   Monica Bectonhomas J Thekkekandam, MD  Olmesartan-Amlodipine-HCTZ [facility](TRIBENZOR) 40-10-25 MG TABS Take by mouth.    Historical Provider, MD  OMEPRAZOLE PO Take by mouth.    Historical Provider, MD  Pitavastatin Calcium (LIVALO) 1 MG TABS Take by mouth.    Historical Provider, MD  predniSONE (DELTASONE) 20 MG tablet 3 tabs po day one, then 2 po daily x 4 days 04/28/16   Junius FinnerErin O'Malley, PA-C  SIMVASTATIN PO Take by mouth.    Historical Provider, MD  temazepam (RESTORIL) 7.5 MG capsule Take 7.5 mg by mouth at bedtime as needed.    Historical Provider, MD  traMADol (ULTRAM) 50 MG tablet Take 1 tablet (50 mg total) by mouth every 6 (six) hours as needed. 04/28/16  Junius Finner, PA-C   Meds Ordered and Administered this Visit   Medications  methylPREDNISolone sodium succinate (SOLU-MEDROL) 40 mg/mL injection 80 mg (80 mg Intramuscular Given 04/28/16 1016)  ketorolac (TORADOL) injection 30 mg (30 mg Intramuscular Given 04/28/16 1016)    BP 116/80 (BP Location: Left Arm)   Pulse 66   Wt 177 lb (80.3 kg)   SpO2 99%   BMI 24.69 kg/m  No data found.   Physical Exam  Constitutional: He is oriented to person, place, and time. He appears well-developed and well-nourished. No distress.  HENT:  Head: Normocephalic and atraumatic.  Eyes: EOM are normal.  Neck: Normal range of motion.  Cardiovascular: Normal  rate.   Pulmonary/Chest: Effort normal.  Musculoskeletal: Normal range of motion. He exhibits tenderness. He exhibits no edema.  Pain going from sitting to standing and from lying to sitting.  Mild tenderness to lower lumbar muscles, Right buttock and Right lateral and posterior thigh.  Negative straight leg raise Full ROM upper and lower extremities bilaterally with 5/5 strength.  Neurological: He is alert and oriented to person, place, and time.  Initial antalgic gait when transitioning from sitting to standing, then normal gait.  Skin: Skin is warm and dry. No rash noted. He is not diaphoretic. No erythema.  Psychiatric: He has a normal mood and affect. His behavior is normal.  Nursing note and vitals reviewed.   Urgent Care Course   Clinical Course    Procedures (including critical care time)  Labs Review Labs Reviewed - No data to display  Imaging Review No results found.   MDM   1. Acute bilateral low back pain with right-sided sciatica    Pt c/o bilateral lower back pain that is worse on Right side, radiating down Right thigh. No red flag symptoms.  No known injury. No indication for imaging at this time.  Pain likely due to muscle strain from either sleeping in a poor position or from twisting/moving doing day to day activities.    Encouraged rest and to limit heavy lifting or twisting until pain subsides.  Tx in UC: Solumedrol 80mg  IM and Toradol 30mg  IM Rx: Flexeril, tramadol and prednisone  Pt info packet with home exercises for sciatica provided. Encouraged f/u with Dr. Benjamin Stain (has seen in the past), Sports Medicine, next week if not improving.    Junius Finner, PA-C 04/28/16 1039

## 2016-06-10 ENCOUNTER — Ambulatory Visit (INDEPENDENT_AMBULATORY_CARE_PROVIDER_SITE_OTHER): Payer: BLUE CROSS/BLUE SHIELD | Admitting: Family Medicine

## 2016-06-10 VITALS — BP 135/78 | HR 90 | Wt 182.0 lb

## 2016-06-10 DIAGNOSIS — I10 Essential (primary) hypertension: Secondary | ICD-10-CM

## 2016-06-10 DIAGNOSIS — G4700 Insomnia, unspecified: Secondary | ICD-10-CM

## 2016-06-10 DIAGNOSIS — M659 Synovitis and tenosynovitis, unspecified: Secondary | ICD-10-CM

## 2016-06-10 DIAGNOSIS — E782 Mixed hyperlipidemia: Secondary | ICD-10-CM | POA: Diagnosis not present

## 2016-06-10 MED ORDER — AMLODIPINE BESYLATE 10 MG PO TABS: 10.0000 mg | ORAL_TABLET | Freq: Every day | ORAL | 1 refills | Status: DC

## 2016-06-10 MED ORDER — TEMAZEPAM 15 MG PO CAPS: 15.0000 mg | ORAL_CAPSULE | Freq: Every evening | ORAL | 5 refills | Status: DC | PRN

## 2016-06-10 MED ORDER — HYDROCHLOROTHIAZIDE 25 MG PO TABS: 25.0000 mg | ORAL_TABLET | Freq: Every day | ORAL | 1 refills | Status: DC

## 2016-06-10 MED ORDER — OLMESARTAN MEDOXOMIL 40 MG PO TABS: 40.0000 mg | ORAL_TABLET | Freq: Every day | ORAL | 1 refills | Status: DC

## 2016-06-10 MED ORDER — CLONAZEPAM 1 MG PO TABS: 1.0000 mg | ORAL_TABLET | Freq: Every evening | ORAL | 1 refills | Status: DC | PRN

## 2016-06-10 MED ORDER — DICLOFENAC SODIUM 1 % TD GEL: TRANSDERMAL | 1 refills | Status: DC

## 2016-06-10 NOTE — Patient Instructions (Signed)
Thank you for coming in today. Switch to generic cheaper medicines if able.  Return in about 6 months for recheck.  Get fasting labs in April or so.  Let me know if you need somewhere cheaper.  Use the voltaren gel for the finger as needed.    Call or go to the ER if you develop a large red swollen joint with extreme pain or oozing puss.

## 2016-06-10 NOTE — Progress Notes (Signed)
Joel James is a 55 y.o. male who [name] today for  1) right hand pain. In July of this year patient injured his right hand. He was seen in urgent care x-rays are unremarkable. He notes persistent pain at the second MCP. He notes worsening stiffness now. He has not tried much treatment yet. He notes this is obnoxious and limits his ability to grip things with his dominant right hand. In terms are moderate.  2) hypertension: Patient wishes to establish care at this location. He will lose his health insurance in the new year and notes that his previous primary care doctor is somewhat difficult to work with. He feels pretty well controlled with the below medications. He currently is taking a combination of Benicar amlodipine and hydrochlorothiazide.  3) hyperlipidemia: Patient takes Livalo daily. He knows he's had trouble tolerating other statins in the past due to elevated liver enzymes.  4) insomnia: Patient takes temazepam most nights. He takes 15 mg as needed. Very intermittently will use clonazepam additionally to help control sleep.   Past Medical History:  Diagnosis Date  . Hyperlipidemia   . Hypertension    Past Surgical History:  Procedure Laterality Date  . APPENDECTOMY     Social History  Substance Use Topics  . Smoking status: Never Smoker  . Smokeless tobacco: Never Used  . Alcohol use Yes   family history includes Diabetes in his father; Heart attack in his father; Heart disease in his brother; Heart failure in his father; Hyperlipidemia in his father and mother; Hypertension in his father and mother.  ROS as above:  Medications: Current Outpatient Prescriptions  Medication Sig Dispense Refill  . aspirin 325 MG tablet Take 325 mg by mouth daily.    . cyclobenzaprine (FLEXERIL) 10 MG tablet Take 1 tablet (10 mg total) by mouth 2  (two) times daily as needed. 20 tablet 0  . diclofenac sodium (VOLTAREN) 1 % GEL Apply 1 to 2 gm each application, 2 to 3 times daily 100 g 1  . meloxicam (MOBIC) 15 MG tablet One tab PO qAM with breakfast for 2 weeks, then daily prn pain. 30 tablet 3  . OMEPRAZOLE PO Take by mouth.    . Pitavastatin Calcium (LIVALO) 1 MG TABS Take by mouth.    . temazepam (RESTORIL) 15 MG capsule Take 1 capsule (15 mg total) by mouth at bedtime as needed. 30 capsule 5  . amLODipine (NORVASC) 10 MG tablet Take 1 tablet (10 mg total) by mouth daily. 90 tablet 1  . clonazePAM (KLONOPIN) 1 MG tablet Take 1 tablet (1 mg total) by mouth at bedtime as needed (severe insomnia). 30 tablet 1  . hydrochlorothiazide (HYDRODIURIL) 25 MG tablet Take 1 tablet (25 mg total) by mouth daily. 90 tablet 1  . olmesartan (BENICAR) 40 MG tablet Take 1 tablet (40 mg total) by mouth daily. 90 tablet 1   No current facility-administered medications for this visit.    Allergies  Allergen Reactions  . Codeine   . Penicillins   . Propoxyphene Hcl     Health Maintenance Health Maintenance  Topic Date Due  . Hepatitis C Screening  Sep 05, 1960  . HIV Screening  09/25/1974  . COLONOSCOPY  09/24/2009  . INFLUENZA VACCINE  02/02/2016  . TETANUS/TDAP  02/19/2022     Exam:  BP 135/78   Pulse 90   Wt 182 lb (82.6 kg)   BMI 25.38  kg/m  Gen: Well NAD HEENT: EOMI,  MMM Lungs: Normal work of breathing. CTABL Heart: RRR no MRG Abd: NABS, Soft. Nondistended, Nontender Exts: Brisk capillary refill, warm and well perfused.  Right hand: Slightly swollen second MCP. Mildly tender. Normal strength. [facility] very [facility]  Procedure: Real-time Ultrasound Guided Injection of right second MCP  Device: GE Logiq E  Images permanently stored and available for review in the ultrasound unit. Verbal informed consent obtained. Discussed risks and benefits of procedure. Warned about infection bleeding damage to structures  skin hypopigmentation and fat atrophy among others. Patient expresses understanding and agreement Time-out conducted.  Noted no overlying erythema, induration, or other signs of local infection.  Skin prepped in a sterile fashion.  Local anesthesia: Topical Ethyl chloride.  With sterile technique and under real time ultrasound guidance: 0.2 mL of 10 mg per 1 mL dexamethasone solution, and 0.2 mL of lidocaine injected easily.  Completed without difficulty  Pain immediately resolved suggesting accurate placement of the medication.  Advised to call if fevers/chills, erythema, induration, drainage, or persistent bleeding.  Images permanently stored and available for review in the ultrasound unit.  Impression: Technically successful ultrasound guided injection.   Study Result   CLINICAL DATA:  Patient hit hand on solid object 2 weeks prior. Pain primarily second digit  EXAM: RIGHT HAND - COMPLETE 3+ VIEW  COMPARISON:  None.  FINDINGS: Frontal, oblique, and lateral views obtained. There is no demonstrable fracture or dislocation. The joint spaces appear normal. No erosive change.  IMPRESSION: Negative.   Electronically Signed   By: Bretta BangWilliam  Woodruff III M.D.   On: 01/18/2016 11:57      No results found for this or any previous visit (from the past 72 hour(s)). No results found.    Assessment and Plan: 55 y.o. male with  1) hand pain: Traumatic synovitis of the second MCP. Discussed options. Plan for injection today with dexamethasone followed by diclofenac gel as needed.   2) hypertension: We'll switch to generic hydrochlorothiazide, amlodipine, and Benicar. Recheck labs in about for 5 months and recheck blood pressure in about 6 months.  3) Hyperlipidemia: Continue Livalo. This will be the most expensive medication. I provided patient with some samples. Recheck lipids in a few months.  4) insomnia: Continue current regimen. Recheck in 6  months.   Orders Placed This Encounter  Procedures  . CBC  . COMPLETE METABOLIC PANEL WITH GFR  . Lipid panel    Discussed warning signs or symptoms. Please see discharge instructions. Patient expresses understanding.

## 2016-06-13 LAB — HM COLONOSCOPY

## 2016-06-21 ENCOUNTER — Telehealth: Payer: Self-pay | Admitting: Family Medicine

## 2016-06-21 NOTE — Telephone Encounter (Signed)
Sent prior authorization through cover my meds waiting on determination. - CF °

## 2016-06-28 NOTE — Telephone Encounter (Signed)
[facility] and [facility] denied Diclofenac Sodium due to it is only covered to treat osteoarthritis of elbow, wrist, hand, Knee or ankle. - CF  [facility] # [id number]

## 2016-09-19 ENCOUNTER — Ambulatory Visit (INDEPENDENT_AMBULATORY_CARE_PROVIDER_SITE_OTHER): Payer: BLUE CROSS/BLUE SHIELD | Admitting: Family Medicine

## 2016-09-19 ENCOUNTER — Encounter: Payer: Self-pay | Admitting: Family Medicine

## 2016-09-19 VITALS — BP 131/75 | HR 67 | Wt 182.0 lb

## 2016-09-19 DIAGNOSIS — I10 Essential (primary) hypertension: Secondary | ICD-10-CM

## 2016-09-19 DIAGNOSIS — M25512 Pain in left shoulder: Secondary | ICD-10-CM

## 2016-09-19 DIAGNOSIS — N529 Male erectile dysfunction, unspecified: Secondary | ICD-10-CM

## 2016-09-19 DIAGNOSIS — E782 Mixed hyperlipidemia: Secondary | ICD-10-CM

## 2016-09-19 DIAGNOSIS — M75101 Unspecified rotator cuff tear or rupture of right shoulder, not specified as traumatic: Secondary | ICD-10-CM

## 2016-09-19 MED ORDER — DICLOFENAC SODIUM 1 % TD GEL: TRANSDERMAL | 1 refills | Status: DC

## 2016-09-19 MED ORDER — ATORVASTATIN CALCIUM 40 MG PO TABS: 40.0000 mg | ORAL_TABLET | Freq: Every day | ORAL | 3 refills | Status: DC

## 2016-09-19 MED ORDER — SILDENAFIL CITRATE 20 MG PO TABS: 20.0000 mg | ORAL_TABLET | ORAL | 11 refills | Status: DC | PRN

## 2016-09-19 NOTE — Progress Notes (Signed)
Joel James is a 56 y.o. male who presents to [facility]: Primary [facility] Medicine today for follow-up bilateral shoulder pain hypertension and hyperlipidemia.  Hypertension: Patient takes the medications listed below with no chest pain palpitations shortness of breath he feels well.  Hyperlipidemia: Patient has been taking Livalo samples he has run out. He notes in the past as tolerated atorvastatin and would like to switch to that for cost.  Bilateral shoulder pain: Patient has a history of bilateral shoulder pain status post injections previously. He's been doing home physical therapy and notes that his pain has returned recently. Pain is worse with above the shoulder motion and reaching back. He denies any radiating pain weakness or numbness.   Past Medical History:  Diagnosis Date  . Hyperlipidemia   . Hypertension    Past Surgical History:  Procedure Laterality Date  . APPENDECTOMY     Social History  Substance Use Topics  . Smoking status: Never Smoker  . Smokeless tobacco: Never Used  . Alcohol use Yes   family history includes Diabetes in his father; Heart attack in his father; Heart disease in his brother; Heart failure in his father; Hyperlipidemia in his father and mother; Hypertension in his father and mother.  ROS as above:  Medications: Current Outpatient Prescriptions  Medication Sig Dispense Refill  . amLODipine (NORVASC) 10 MG tablet Take 1 tablet (10 mg total) by mouth daily. 90 tablet 1  . aspirin 325 MG tablet Take 325 mg by mouth daily.    Marland Kitchen atorvastatin (LIPITOR) 40 MG tablet Take 1 tablet (40 mg total) by mouth daily. 90 tablet 3  . clonazePAM (KLONOPIN) 1 MG tablet Take 1 tablet (1 mg total) by mouth at bedtime as needed (severe insomnia). 30 tablet 1  . cyclobenzaprine (FLEXERIL) 10 MG tablet Take 1 tablet (10 mg total) by mouth 2 (two) times daily as  needed. 20 tablet 0  . diclofenac sodium (VOLTAREN) 1 % GEL Apply 1 to 2 gm each application, 2 to 3 times daily 100 g 1  . hydrochlorothiazide (HYDRODIURIL) 25 MG tablet Take 1 tablet (25 mg total) by mouth daily. 90 tablet 1  . meloxicam (MOBIC) 15 MG tablet One tab PO qAM with breakfast for 2 weeks, then daily prn pain. 30 tablet 3  . olmesartan (BENICAR) 40 MG tablet Take 1 tablet (40 mg total) by mouth daily. 90 tablet 1  . OMEPRAZOLE PO Take by mouth.    . sildenafil (REVATIO) 20 MG tablet Take 1 tablet (20 mg total) by mouth as needed. 50 tablet 11  . temazepam (RESTORIL) 15 MG capsule Take 1 capsule (15 mg total) by mouth at bedtime as needed. 30 capsule 5   No current facility-administered medications for this visit.    Allergies  Allergen Reactions  . Codeine   . Penicillins   . Propoxyphene Hcl     Health Maintenance Health Maintenance  Topic Date Due  . Hepatitis C Screening  April 13, 1961  . HIV Screening  09/25/1974  . COLONOSCOPY  09/24/2009  . INFLUENZA VACCINE  03/22/2017 (Originally 02/02/2016)  . TETANUS/TDAP  02/19/2022     Exam:  BP 131/75   Pulse 67   Wt 182 lb (82.6 kg)   BMI 25.38 kg/m  Gen: Well NAD HEENT: EOMI,  MMM Lungs: Normal work of breathing. CTABL Heart: RRR no MRG Abd: NABS, Soft. Nondistended, Nontender Exts: Brisk capillary refill, warm and well perfused.  MSK: Shoulders  bilaterally are normal appearing and nontender. Impaired abduction due to pain. Normal external and internal rotation. Positive Hawkins and Neer's test and positive empty can test bilaterally. Otherwise strength is intact.  Procedure: Real-time Ultrasound Guided Injection of right subacromial space  Device: GE Logiq E  Images permanently stored and available for review in the ultrasound unit. Verbal informed consent obtained. Discussed risks and benefits of procedure. Warned about infection bleeding damage to structures skin hypopigmentation and fat atrophy among  others. Patient expresses understanding and agreement Time-out conducted.  Noted no overlying erythema, induration, or other signs of local infection.  Skin prepped in a sterile fashion.  Local anesthesia: Topical Ethyl chloride.  With sterile technique and under real time ultrasound guidance: 40mg  kenalog and 3ml marcaine injected easily.  Completed without difficulty  Pain immediately resolved suggesting accurate placement of the medication.  Advised to call if fevers/chills, erythema, induration, drainage, or persistent bleeding.  Images permanently stored and available for review in the ultrasound unit.  Impression: Technically successful ultrasound guided injection.  Procedure: Real-time Ultrasound Guided Injection of left subacromial space  Device: GE Logiq E  Images permanently stored and available for review in the ultrasound unit. Verbal informed consent obtained. Discussed risks and benefits of procedure. Warned about infection bleeding damage to structures skin hypopigmentation and fat atrophy among others. Patient expresses understanding and agreement Time-out conducted.  Noted no overlying erythema, induration, or other signs of local infection.  Skin prepped in a sterile fashion.  Local anesthesia: Topical Ethyl chloride.  With sterile technique and under real time ultrasound guidance: 40mg  kenalog and 3ml marcaine injected easily.  Completed without difficulty  Pain immediately resolved suggesting accurate placement of the medication.  Advised to call if fevers/chills, erythema, induration, drainage, or persistent bleeding.  Images permanently stored and available for review in the ultrasound unit.  Impression: Technically successful ultrasound guided injection.    No results found for this or any previous visit (from the past 72 hour(s)). No results found.    Assessment and Plan: 56 y.o. male with  HTN: Doing well. Continue current regimen.  We'll check labs in about 6 months.  Hyperlipidemia: Switch to Lipitor. Check labs in 6 months.  Bilateral shoulder pain: Subacromial bursitis. Continue injections and therapy as needed.  Errectile dysfunction: Refill sildenafil.   No orders of the defined types were placed in this encounter.  Meds ordered this encounter  Medications  . sildenafil (REVATIO) 20 MG tablet    Sig: Take 1 tablet (20 mg total) by mouth as needed.    Dispense:  50 tablet    Refill:  11  . atorvastatin (LIPITOR) 40 MG tablet    Sig: Take 1 tablet (40 mg total) by mouth daily.    Dispense:  90 tablet    Refill:  3  . diclofenac sodium (VOLTAREN) 1 % GEL    Sig: Apply 1 to 2 gm each application, 2 to 3 times daily    Dispense:  100 g    Refill:  1     Discussed warning signs or symptoms. Please see discharge instructions. Patient expresses understanding.

## 2016-09-19 NOTE — Patient Instructions (Addendum)
Thank you for coming in today. Continue medicines Let me know if you need refills.  Recheck in 1 year or sooner if needed.   Call or go to the ER if you develop a large red swollen joint with extreme pain or oozing puss.     Let me know in 6 months. We will check labs then.  You do not need to see me for these labs.  I will arrange for them with a phone call or message.   Labs are Lipid panel and CMP.

## 2017-02-03 ENCOUNTER — Ambulatory Visit (INDEPENDENT_AMBULATORY_CARE_PROVIDER_SITE_OTHER): Payer: Self-pay | Admitting: Sports Medicine

## 2017-02-03 ENCOUNTER — Encounter: Payer: Self-pay | Admitting: Sports Medicine

## 2017-02-03 DIAGNOSIS — M75101 Unspecified rotator cuff tear or rupture of right shoulder, not specified as traumatic: Secondary | ICD-10-CM

## 2017-02-03 DIAGNOSIS — M75102 Unspecified rotator cuff tear or rupture of left shoulder, not specified as traumatic: Secondary | ICD-10-CM

## 2017-02-03 NOTE — Progress Notes (Signed)
  Subjective:    CC: Bilateral shoulder pain  HPI: This is a pleasant 56 year old male, he's had shoulder pain for years now, I last injected him 2 years ago, Dr. Denyse Amassorey last injected his shoulder almost 1 year ago. He is having recurrence of pain over the deltoids, worse with overhead activities, moderate, persistent without radiation. Works as an Airline pilotaccountant.  Past medical history:  Negative.  See flowsheet/record as well for more information.  Surgical history: Negative.  See flowsheet/record as well for more information.  Family history: Negative.  See flowsheet/record as well for more information.  Social history: Negative.  See flowsheet/record as well for more information.  Allergies, and medications have been entered into the medical record, reviewed, and no changes needed.   Review of Systems: No fevers, chills, night sweats, weight loss, chest pain, or shortness of breath.   Objective:    General: Well Developed, well nourished, and in no acute distress.  Neuro: Alert and oriented x3, extra-ocular muscles intact, sensation grossly intact.  HEENT: Normocephalic, atraumatic, pupils equal round reactive to light, neck supple, no masses, no lymphadenopathy, thyroid nonpalpable.  Skin: Warm and dry, no rashes. Cardiac: Regular rate and rhythm, no murmurs rubs or gallops, no lower extremity edema.  Respiratory: Clear to auscultation bilaterally. Not using accessory muscles, speaking in full sentences. Bilateral shoulders: Inspection reveals no abnormalities, atrophy or asymmetry. Palpation is normal with no tenderness over AC joint or bicipital groove. ROM is full in all planes. Rotator cuff strength normal throughout. Positive Neer and Hawkin's tests, empty can. Speeds and Yergason's tests normal. No labral pathology noted with negative Obrien's, negative crank, negative clunk, and good stability. Normal scapular function observed. No painful arc and no drop arm sign. No  apprehension sign  Procedure: Real-time Ultrasound Guided Injection of right subacromial bursa Device: GE Logiq E  Verbal informed consent obtained.  Time-out conducted.  Noted no overlying erythema, induration, or other signs of local infection.  Skin prepped in a sterile fashion.  Local anesthesia: Topical Ethyl chloride.  With sterile technique and under real time ultrasound guidance:  1 mL kenalog 40, 1 mL lidocaine, 1 mL bupivacaine injected easily Completed without difficulty  Pain immediately resolved suggesting accurate placement of the medication.  Advised to call if fevers/chills, erythema, induration, drainage, or persistent bleeding.  Images permanently stored and available for review in the ultrasound unit.  Impression: Technically successful ultrasound guided injection.  Procedure: Real-time Ultrasound Guided Injection of left subacromial bursa Device: GE Logiq E  Verbal informed consent obtained.  Time-out conducted.  Noted no overlying erythema, induration, or other signs of local infection.  Skin prepped in a sterile fashion.  Local anesthesia: Topical Ethyl chloride.  With sterile technique and under real time ultrasound guidance:  1 mL kenalog 40, 1 mL lidocaine, 1 mL bupivacaine injected easily Completed without difficulty  Pain immediately resolved suggesting accurate placement of the medication.  Advised to call if fevers/chills, erythema, induration, drainage, or persistent bleeding.  Images permanently stored and available for review in the ultrasound unit.  Impression: Technically successful ultrasound guided injection.  Impression and Recommendations:    Rotator cuff syndrome of both shoulders Bilateral subacromial injection, previous injection was approximately 1 year ago.

## 2017-02-03 NOTE — Assessment & Plan Note (Signed)
Bilateral subacromial injection, previous injection was approximately 1 year ago.

## 2017-06-14 ENCOUNTER — Encounter: Payer: Self-pay | Admitting: Family Medicine

## 2017-06-14 ENCOUNTER — Ambulatory Visit (INDEPENDENT_AMBULATORY_CARE_PROVIDER_SITE_OTHER): Payer: Self-pay | Admitting: Family Medicine

## 2017-06-14 VITALS — BP 162/94 | HR 71 | Ht 71.0 in | Wt 176.0 lb

## 2017-06-14 DIAGNOSIS — E782 Mixed hyperlipidemia: Secondary | ICD-10-CM

## 2017-06-14 DIAGNOSIS — I10 Essential (primary) hypertension: Secondary | ICD-10-CM

## 2017-06-14 DIAGNOSIS — M75101 Unspecified rotator cuff tear or rupture of right shoulder, not specified as traumatic: Secondary | ICD-10-CM

## 2017-06-14 DIAGNOSIS — M75102 Unspecified rotator cuff tear or rupture of left shoulder, not specified as traumatic: Secondary | ICD-10-CM

## 2017-06-14 NOTE — Patient Instructions (Signed)
Thank you for coming in today. Get fasting labs soon.  Call or go to the ER if you develop a large red swollen joint with extreme pain or oozing puss.  Return sooner if needed.

## 2017-06-14 NOTE — Progress Notes (Signed)
Joel James is a 56 y.o. male who [name] today for shoulder pain BL and HTN, HLD.   Shoulder Pain: Joel James has a history of Bilateral Shoulder pain due to RTC tendonitis. He has been receiving subacromial injections every 3-6 months for a while now. He last had injections in August of 2018 which lasted until about 1 week ago. He notes that the pain has returned and is located in the BL upper arm worse with overhead motion and reaching back. He denies any radiating pain. The pain is consistent with prior pain due to RTC tendonitis.   HTN: Joel James continues to take HCTZ and Benicar listed below. He checks his BP at home and notes that most of the time the pressure is 120s/70s.  He denies any CP or headache or lightheaded or SOB feelings.   HLD: Joel James continues to take Lipitor listed below. He denies any significant diffuse muscle aches or pain.    Past Medical History:  Diagnosis Date  . Hyperlipidemia   . Hypertension    Past Surgical History:  Procedure Laterality Date  . APPENDECTOMY     Social History   Tobacco Use  . Smoking status: Never Smoker  . Smokeless tobacco: Never Used  Substance Use Topics  . Alcohol use: Yes   family history includes Diabetes in his father; Heart attack in his father; Heart disease in his brother; Heart failure in his father; Hyperlipidemia in his father and mother; Hypertension in his father and mother.  ROS as above:  Medications: Current Outpatient Medications  Medication Sig Dispense Refill  . amLODipine (NORVASC) 10 MG tablet Take 1 tablet (10 mg total) by mouth daily. 90 tablet 1  . aspirin 325 MG tablet Take 325 mg by mouth daily.    Marland Kitchen. atorvastatin (LIPITOR) 40 MG tablet Take 1 tablet (40 mg total) by mouth daily. 90 tablet 3  . clonazePAM (KLONOPIN) 1 MG tablet Take 1 tablet (1 mg total) by mouth at bedtime  as needed (severe insomnia). 30 tablet 1  . cyclobenzaprine (FLEXERIL) 10 MG tablet Take 1 tablet (10 mg total) by mouth 2 (two) times daily as needed. 20 tablet 0  . diclofenac sodium (VOLTAREN) 1 % GEL Apply 1 to 2 gm each application, 2 to 3 times daily 100 g 1  . hydrochlorothiazide (HYDRODIURIL) 25 MG tablet Take 1 tablet (25 mg total) by mouth daily. 90 tablet 1  . meloxicam (MOBIC) 15 MG tablet One tab PO qAM with breakfast for 2 weeks, then daily prn pain. 30 tablet 3  . olmesartan (BENICAR) 40 MG tablet Take 1 tablet (40 mg total) by mouth daily. 90 tablet 1  . OMEPRAZOLE PO Take by mouth.    . sildenafil (REVATIO) 20 MG tablet Take 1 tablet (20 mg total) by mouth as needed. 50 tablet 11  . temazepam (RESTORIL) 15 MG capsule Take 1 capsule (15 mg total) by mouth at bedtime as needed. 30 capsule 5   No current facility-administered medications for this visit.    Allergies  Allergen Reactions  . Codeine   . Penicillins   . Propoxyphene Hcl     Health Maintenance Health Maintenance  Topic Date Due  . Hepatitis C Screening  1960-09-20  . HIV Screening  09/25/1974  . COLONOSCOPY  09/24/2009  . INFLUENZA VACCINE  02/01/2017  . TETANUS/TDAP  02/19/2022     Exam:  BP (!) 162/94  Pulse 71   Ht 5\' 11"  (1.803 m)   Wt 176 lb (79.8 kg)   BMI 24.55 kg/m  Gen: Well NAD HEENT: EOMI,  MMM Lungs: Normal work of breathing. CTABL Heart: RRR no MRG Abd: NABS, Soft. Nondistended, Nontender Exts: Brisk capillary refill, warm and well perfused.  MSK: Shoulder BL normal appearing. Non-tender. Normal motion.  Pain with abduction. Positive impingement tests.  Normal strength.  Pulses and sensation intact BL UE.   Procedure: Real-time Ultrasound Guided Injection of right subacromial bursa   Device: GE Logiq E  Images permanently stored and available for review in the ultrasound unit. Verbal informed consent obtained. Discussed risks and benefits of procedure. Warned about infection  bleeding damage to structures skin hypopigmentation and fat atrophy among others. Patient expresses understanding and agreement Time-out conducted.  Noted no overlying erythema, induration, or other signs of local infection.  Skin prepped in a sterile fashion.  Local anesthesia: Topical Ethyl chloride.  With sterile technique and under real time ultrasound guidance: 40mg  kenalog and 3ml marcaine injected easily.  Completed without difficulty  Pain immediately resolved suggesting accurate placement of the medication.  Advised to call if fevers/chills, erythema, induration, drainage, or persistent bleeding.  Images permanently stored and available for review in the ultrasound unit.  Impression: Technically successful ultrasound guided injection.   Procedure: Real-time Ultrasound Guided Injection of left subacromial injection  Device: GE Logiq E  Images permanently stored and available for review in the ultrasound unit. Verbal informed consent obtained. Discussed risks and benefits of procedure. Warned about infection bleeding damage to structures skin hypopigmentation and fat atrophy among others. Patient expresses understanding and agreement Time-out conducted.  Noted no overlying erythema, induration, or other signs of local infection.  Skin prepped in a sterile fashion.  Local anesthesia: Topical Ethyl chloride.  With sterile technique and under real time ultrasound guidance: 40mg  kenalog and 3ml marcaine injected easily.  Completed without difficulty  Pain immediately resolved suggesting accurate placement of the medication.  Advised to call if fevers/chills, erythema, induration, drainage, or persistent bleeding.  Images permanently stored and available for review in the ultrasound unit.  Impression: Technically successful ultrasound guided injection.    No results found for this or any previous visit (from the past 72 hour(s)). No results  found.    Assessment and Plan: 56 y.o. male with BL Shoulder pain due to impingement and RTC tendonitis. Nail seems to be doing well. Plan to continue HEP and occasional injections. Plan to follow up PRN.   HTN: Blood pressure typically well controlled. Plan to check fasting labs soon and check home BP via log.  Recheck in 6-12 months.   HLD: Check fasting lipids. Continue current medicines.    Orders Placed This Encounter  Procedures  . CBC  . COMPLETE METABOLIC PANEL WITH GFR  . Lipid Panel w/reflex Direct LDL   No orders of the defined types were placed in this encounter.    Discussed warning signs or symptoms. Please see discharge instructions. Patient expresses understanding.

## 2017-06-21 ENCOUNTER — Ambulatory Visit: Payer: Self-pay | Admitting: Family Medicine

## 2017-07-10 ENCOUNTER — Encounter: Payer: Self-pay | Admitting: Family Medicine

## 2017-07-10 ENCOUNTER — Telehealth: Payer: Self-pay | Admitting: Family Medicine

## 2017-07-10 ENCOUNTER — Ambulatory Visit: Payer: Self-pay | Admitting: Family Medicine

## 2017-07-10 ENCOUNTER — Ambulatory Visit (INDEPENDENT_AMBULATORY_CARE_PROVIDER_SITE_OTHER): Payer: Self-pay | Admitting: Family Medicine

## 2017-07-10 VITALS — BP 167/100 | HR 73 | Ht 71.0 in | Wt 176.0 lb

## 2017-07-10 DIAGNOSIS — I10 Essential (primary) hypertension: Secondary | ICD-10-CM

## 2017-07-10 DIAGNOSIS — G4700 Insomnia, unspecified: Secondary | ICD-10-CM

## 2017-07-10 DIAGNOSIS — E782 Mixed hyperlipidemia: Secondary | ICD-10-CM

## 2017-07-10 DIAGNOSIS — M75101 Unspecified rotator cuff tear or rupture of right shoulder, not specified as traumatic: Secondary | ICD-10-CM

## 2017-07-10 DIAGNOSIS — M75102 Unspecified rotator cuff tear or rupture of left shoulder, not specified as traumatic: Secondary | ICD-10-CM

## 2017-07-10 MED ORDER — PREDNISONE 50 MG PO TABS: 50.0000 mg | ORAL_TABLET | Freq: Every day | ORAL | 0 refills | Status: DC

## 2017-07-10 MED ORDER — OLMESARTAN MEDOXOMIL 40 MG PO TABS: 40.0000 mg | ORAL_TABLET | Freq: Every day | ORAL | 1 refills | Status: DC

## 2017-07-10 MED ORDER — HYDROCHLOROTHIAZIDE 25 MG PO TABS: 25.0000 mg | ORAL_TABLET | Freq: Every day | ORAL | 1 refills | Status: DC

## 2017-07-10 MED ORDER — AMLODIPINE BESYLATE 10 MG PO TABS: 10.0000 mg | ORAL_TABLET | Freq: Every day | ORAL | 1 refills | Status: DC

## 2017-07-10 MED ORDER — DOXEPIN HCL 6 MG PO TABS: 6.0000 mg | ORAL_TABLET | Freq: Every day | ORAL | 1 refills | Status: DC

## 2017-07-10 MED ORDER — CARVEDILOL 6.25 MG PO TABS: 6.2500 mg | ORAL_TABLET | Freq: Two times a day (BID) | ORAL | 3 refills | Status: DC

## 2017-07-10 NOTE — Telephone Encounter (Signed)
Patient called at 8:00 am this morning requesting an appointment for extreme arm pain. I adv him you were completely booked up and I could send a phone message asking if he could be worked in but may have to go to urgent care. None of the other providers have openings to offer. Please adv he said whatever time you are able to work him in he could be there. Thank you

## 2017-07-10 NOTE — Patient Instructions (Signed)
Thank you for coming in today. Take prednisone daily for 5 days.  Take doxepin at night for insomnia.  Continue blood pressure medicine.  Add Carvidiol twice daily.  If pressure are still over 140/80 double it as long as your hear rate is more than 60 Send me messages through [employer].

## 2017-07-10 NOTE — Telephone Encounter (Signed)
Worked in via double book today at [employer]:10

## 2017-07-10 NOTE — Progress Notes (Signed)
Joel James is a 57 y.o. male who presents to [facility] today for evaluation of left shoulder pain.  Patient endorsing 4-5 day history of worsening shoulder pain. Patient has baseline bilateral rotator cuff syndrome requiring injections every 4 months. Last injection of bilateral shoulders was 1 month ago. Patient says that this issue is new however. Pain is primarliy located over posterior shoulder. Pain is achy at baseline and intermittently shoots to mid posterior arm. Patient states that having hands above head or holding steering wheel exacerbates pain. Pain is worst at night. Otherwise, patient states that pain is relatively constant. Does note that he had neck pain 1 week ago and can't say if this shoulder pain is related or not.   Otherwise, patient's blood pressure has been elevated at home. He is on max dose of amlodipine, ARM, and HCTZ.   Patient is complaining of insomnia. Long history of sleep maintenance insomnia. Has tried 5+ different medications. Had sleep study indicated insomnia without OSA.    Past Medical History:  Diagnosis Date  . Hyperlipidemia   . Hypertension    Past Surgical History:  Procedure Laterality Date  . APPENDECTOMY     Social History   Tobacco Use  . Smoking status: Never Smoker  . Smokeless tobacco: Never Used  Substance Use Topics  . Alcohol use: Yes   ROS:  As above  Medications: Current Outpatient Medications  Medication Sig Dispense Refill  . amLODipine (NORVASC) 10 MG tablet Take 1 tablet (10 mg total) by mouth daily. 90 tablet 1  . aspirin 325 MG tablet Take 325 mg by mouth daily.    Marland Kitchen. atorvastatin (LIPITOR) 40 MG tablet Take 1 tablet (40 mg total) by mouth daily. 90 tablet 3  . clonazePAM (KLONOPIN) 1 MG tablet Take 1 tablet (1 mg total) by mouth at bedtime as needed (severe insomnia). 30 tablet 1  . cyclobenzaprine (FLEXERIL) 10 MG tablet Take 1 tablet (10 mg total) by mouth 2 (two)  times daily as needed. 20 tablet 0  . diclofenac sodium (VOLTAREN) 1 % GEL Apply 1 to 2 gm each application, 2 to 3 times daily 100 g 1  . hydrochlorothiazide (HYDRODIURIL) 25 MG tablet Take 1 tablet (25 mg total) by mouth daily. 90 tablet 1  . meloxicam (MOBIC) 15 MG tablet One tab PO qAM with breakfast for 2 weeks, then daily prn pain. 30 tablet 3  . olmesartan (BENICAR) 40 MG tablet Take 1 tablet (40 mg total) by mouth daily. 90 tablet 1  . OMEPRAZOLE PO Take by mouth.    . sildenafil (REVATIO) 20 MG tablet Take 1 tablet (20 mg total) by mouth as needed. 50 tablet 11  . temazepam (RESTORIL) 15 MG capsule Take 1 capsule (15 mg total) by mouth at bedtime as needed. 30 capsule 5  . carvedilol (COREG) 6.25 MG tablet Take 1 tablet (6.25 mg total) by mouth 2 (two) times daily with a meal. 60 tablet 3  . Doxepin HCl 6 MG TABS Take 1 tablet (6 mg total) by mouth at bedtime. 30 tablet 1  . predniSONE (DELTASONE) 50 MG tablet Take 1 tablet (50 mg total) by mouth daily. 5 tablet 0   No current facility-administered medications for this visit.    Allergies  Allergen Reactions  . Codeine   . Penicillins   . Propoxyphene Hcl      Exam:  BP (!) 167/100   Pulse 73   Ht 5\' 11"  (1.803  m)   Wt 176 lb (79.8 kg)   BMI 24.55 kg/m  General: Well Developed, well nourished, and in no acute distress.  Neuro/Psych: Alert and oriented x3, extra-ocular muscles intact, able to move all 4 extremities, sensation grossly intact. Skin: Warm and dry, no rashes noted.  Respiratory: Not using accessory muscles, speaking in full sentences, trachea midline.  Cardiovascular: Pulses palpable, no extremity edema. Abdomen: Does not appear distended. MSK:  L Shoulder: Normal appearing without skin changes or muscular atrophy. Full ROM, but somewhat labored due to pain. Hawkins, empty can negative. Neers positive. Flexion/extension of arm intact, supination/pronation of arm intact, no pain with lat  pullback.  Cervical spine: Nontender to palpation over spinous process. Pain in right shoulder with neck extension. No pain with neck flexion.  No pain with ear to shoulder on either side.  No results found for this or any previous visit (from the past 48 hour(s)). No results found.  Assessment and Plan: 57 y.o. male with likely cervical radiculopathy. Cervical radiculopathy most likely given pain with neck extension on physical exam. Impingment of posterior rotator cuff also possible given positive Neers test.   Patient amenable to 5 day course of steroids given likely cervical radiculopathy. Patient will return to follow up if pain worsens or does not improve in the next several weeks.  HTN: patient remains HTN on 3 maxed out medications. Will add coreg 6.25 today. Patient instructed on risks and benefits of medication, and is amenable to trial.  Insomnia: patient has tried many different medications for sleep maintenance insomnia. Patient would like to try doxepin, which seems reasonable at this point given highly resistant insomnia and multiple failed trials of medication.   Patient instructed to return to follow up if he does not improve.   No orders of the defined types were placed in this encounter.  Meds ordered this encounter  Medications  . carvedilol (COREG) 6.25 MG tablet    Sig: Take 1 tablet (6.25 mg total) by mouth 2 (two) times daily with a meal.    Dispense:  60 tablet    Refill:  3  . amLODipine (NORVASC) 10 MG tablet    Sig: Take 1 tablet (10 mg total) by mouth daily.    Dispense:  90 tablet    Refill:  1  . hydrochlorothiazide (HYDRODIURIL) 25 MG tablet    Sig: Take 1 tablet (25 mg total) by mouth daily.    Dispense:  90 tablet    Refill:  1  . olmesartan (BENICAR) 40 MG tablet    Sig: Take 1 tablet (40 mg total) by mouth daily.    Dispense:  90 tablet    Refill:  1  . Doxepin HCl 6 MG TABS    Sig: Take 1 tablet (6 mg total) by mouth at bedtime.     Dispense:  30 tablet    Refill:  1  . predniSONE (DELTASONE) 50 MG tablet    Sig: Take 1 tablet (50 mg total) by mouth daily.    Dispense:  5 tablet    Refill:  0    Discussed warning signs or symptoms. Please see discharge instructions. Patient expresses understanding.

## 2017-07-14 ENCOUNTER — Telehealth: Payer: Self-pay | Admitting: Family Medicine

## 2017-07-14 MED ORDER — DOXEPIN HCL 10 MG PO CAPS: 10.0000 mg | ORAL_CAPSULE | Freq: Every evening | ORAL | 1 refills | Status: DC | PRN

## 2017-07-14 NOTE — Telephone Encounter (Signed)
Done. Hamda Klutts,CMA  

## 2017-07-14 NOTE — Telephone Encounter (Signed)
Pt called. He cannot get the Doxepin in 6 mg and would like the Deltazone in 10mg .

## 2017-07-14 NOTE — Telephone Encounter (Signed)
Thank you :)

## 2017-07-21 ENCOUNTER — Other Ambulatory Visit: Payer: Self-pay | Admitting: Family Medicine

## 2017-07-28 ENCOUNTER — Encounter: Payer: Self-pay | Admitting: Family Medicine

## 2017-10-06 ENCOUNTER — Other Ambulatory Visit: Payer: Self-pay | Admitting: Family Medicine

## 2017-10-19 LAB — LIPID PANEL
Cholesterol: 167 (ref 0–200)
HDL: 34 — AB (ref 35–70)
LDL Cholesterol: 105
Triglycerides: 139 (ref 40–160)

## 2017-10-19 LAB — CBC AND DIFFERENTIAL
HEMOGLOBIN: 13.3 — AB (ref 13.5–17.5)
PLATELETS: 373 (ref 150–399)
WBC: 4.8

## 2017-10-19 LAB — HEPATIC FUNCTION PANEL
ALK PHOS: 65 (ref 25–125)
ALT: 17 (ref 10–40)
AST: 13 — AB (ref 14–40)
BILIRUBIN, TOTAL: 0.4

## 2017-10-19 LAB — BASIC METABOLIC PANEL
BUN: 13 (ref 4–21)
CREATININE: 1.1 (ref 0.6–1.3)
Potassium: 4.3 (ref 3.4–5.3)
Sodium: 142 (ref 137–147)

## 2017-10-20 ENCOUNTER — Encounter: Payer: Self-pay | Admitting: Family Medicine

## 2017-10-27 ENCOUNTER — Other Ambulatory Visit: Payer: Self-pay | Admitting: Family Medicine

## 2017-10-31 MED ORDER — PREDNISONE 50 MG PO TABS: 50.0000 mg | ORAL_TABLET | Freq: Every day | ORAL | 0 refills | Status: DC

## 2017-11-16 ENCOUNTER — Ambulatory Visit (INDEPENDENT_AMBULATORY_CARE_PROVIDER_SITE_OTHER): Payer: Self-pay | Admitting: Family Medicine

## 2017-11-16 ENCOUNTER — Encounter: Payer: Self-pay | Admitting: Family Medicine

## 2017-11-16 VITALS — BP 125/80 | HR 66 | Wt 183.0 lb

## 2017-11-16 DIAGNOSIS — G4700 Insomnia, unspecified: Secondary | ICD-10-CM

## 2017-11-16 DIAGNOSIS — E782 Mixed hyperlipidemia: Secondary | ICD-10-CM

## 2017-11-16 DIAGNOSIS — M75101 Unspecified rotator cuff tear or rupture of right shoulder, not specified as traumatic: Secondary | ICD-10-CM

## 2017-11-16 DIAGNOSIS — I10 Essential (primary) hypertension: Secondary | ICD-10-CM

## 2017-11-16 DIAGNOSIS — M75102 Unspecified rotator cuff tear or rupture of left shoulder, not specified as traumatic: Secondary | ICD-10-CM

## 2017-11-16 DIAGNOSIS — M25511 Pain in right shoulder: Secondary | ICD-10-CM

## 2017-11-16 MED ORDER — TEMAZEPAM 15 MG PO CAPS: 15.0000 mg | ORAL_CAPSULE | Freq: Every evening | ORAL | 5 refills | Status: DC | PRN

## 2017-11-16 NOTE — Patient Instructions (Signed)
Thank you for coming in today. Call or go to the ER if you develop a large red swollen joint with extreme pain or oozing puss.  Recheck as needed Continue home exercise.

## 2017-11-16 NOTE — Progress Notes (Signed)
Joel James is a 57 y.o. male who presents to [facility]: Primary Care [facility] Medicine today for shoulder pain follow-up hypertension and hyperlipidemia.  Joel James has right shoulder pain due to impingement/bursitis/rotator cuff tendinitis.  He has been doing well with home exercise program and intermittent subacromial bursa injections.  His last injection was in December 2018.  He notes the pain is recently started acting up again.  He notes continued pain in his right lateral upper arm worse with overhead motion and reaching back.  He denies any radiating pain.  He satisfied with continuing current regimen.  Hypertension and hyperlipidemia: Patient takes medications listed below and tolerates them quite well.  He notes when he checks his blood pressure at home is typically well controlled.  He denied chest pain palpitations or shortness of breath.  He notes he had recent labs checked which are currently in the process of being abstracted with appropriate creatinine electrolytes and LDL.  Insomnia: Patient continues to take temazepam which works quite well.   Past Medical History:  Diagnosis Date  . Hyperlipidemia   . Hypertension    Past Surgical History:  Procedure Laterality Date  . APPENDECTOMY     Social History   Tobacco Use  . Smoking status: Never Smoker  . Smokeless tobacco: Never Used  Substance Use Topics  . Alcohol use: Yes   family history includes Diabetes in his father; Heart attack in his father; Heart disease in his brother; Heart failure in his father; Hyperlipidemia in his father and mother; Hypertension in his father and mother.  ROS as above:  Medications: Current Outpatient Medications  Medication Sig Dispense Refill  . amLODipine (NORVASC) 10 MG tablet Take 1 tablet (10 mg total) by mouth daily. 90 tablet 1  . aspirin 325 MG tablet Take 325 mg by mouth daily.    Marland Kitchen  atorvastatin (LIPITOR) 40 MG tablet Take 1 tablet (40 mg total) by mouth daily. 90 tablet 3  . hydrochlorothiazide (HYDRODIURIL) 25 MG tablet Take 1 tablet (25 mg total) by mouth daily. 90 tablet 1  . olmesartan (BENICAR) 40 MG tablet Take 1 tablet (40 mg total) by mouth daily. 90 tablet 1  . omeprazole (PRILOSEC) 20 MG capsule TAKE ONE CAPSULE BY MOUTH 2 TIMES A DAY 180 capsule 0  . OMEPRAZOLE PO Take by mouth.    . sildenafil (REVATIO) 20 MG tablet TAKE 1 TABLET BY MOUTH AS NEEDED 50 tablet 11  . temazepam (RESTORIL) 15 MG capsule Take 1 capsule (15 mg total) by mouth at bedtime as needed. 30 capsule 5   No current facility-administered medications for this visit.    Allergies  Allergen Reactions  . Codeine   . Penicillins   . Propoxyphene Hcl     Health Maintenance Health Maintenance  Topic Date Due  . Hepatitis C Screening  05-Apr-1961  . HIV Screening  09/25/1974  . COLONOSCOPY  09/24/2009  . INFLUENZA VACCINE  02/01/2018  . TETANUS/TDAP  02/19/2022     Exam:  BP 125/80   Pulse 66   Wt 183 lb (83 kg)   BMI 25.52 kg/m  Gen: Well NAD HEENT: EOMI,  MMM Lungs: Normal work of breathing. CTABL Heart: RRR no MRG Abd: NABS, Soft. Nondistended, Nontender Exts: Brisk capillary refill, warm and well perfused.  MSK: Shoulder BL normal appearing. Non-tender. Normal motion.  Right shoulder Pain with abduction. Positive impingement tests.  Normal strength.  Pulses and sensation intact BL UE.  Procedure: Real-time Ultrasound Guided Injection of right subacromial bursa         Device: GE Logiq E  Images permanently stored and available for review in the ultrasound unit. Verbal informed consent obtained. Discussed risks and benefits of procedure. Warned about infection bleeding damage to structures skin hypopigmentation and fat atrophy among others. Patient expresses understanding and agreement Time-out conducted.  Noted no overlying erythema, induration, or other signs of  local infection.  Skin prepped in a sterile fashion.  Local anesthesia: Topical Ethyl chloride.  With sterile technique and under real time ultrasound guidance:  kenalog and 2ml marcaine injected easily.  Completed without difficulty  Pain immediately resolved suggesting accurate placement of the medication.  Advised to call if fevers/chills, erythema, induration, drainage, or persistent bleeding.  Images permanently stored and available for review in the ultrasound unit.  Impression: Technically successful ultrasound guided injection.     Assessment and Plan: 57 y.o. male with  Right shoulder: Exacerbation of chronic recurrent issue.  Continue home exercise program and injection as above.  Injections every 5 to 6 months reasonable.  Hypertension: Well-controlled continue current regimen.  Okay to refill for 1 calendar year.  Hyperlipidemia: Doing quite well.  Continue atorvastatin refills for 1 year.  Insomnia: Doing well continue temazepam.  Will also continue to refill for 1 calendar year if all is well.   No orders of the defined types were placed in this encounter.  Meds ordered this encounter  Medications  . temazepam (RESTORIL) 15 MG capsule    Sig: Take 1 capsule (15 mg total) by mouth at bedtime as needed.    Dispense:  30 capsule    Refill:  5     Discussed warning signs or symptoms. Please see discharge instructions. Patient expresses understanding.

## 2017-11-22 ENCOUNTER — Encounter: Payer: Self-pay | Admitting: Family Medicine

## 2017-12-25 ENCOUNTER — Other Ambulatory Visit: Payer: Self-pay | Admitting: Family Medicine

## 2017-12-30 ENCOUNTER — Other Ambulatory Visit: Payer: Self-pay | Admitting: Family Medicine

## 2018-01-15 ENCOUNTER — Other Ambulatory Visit: Payer: Self-pay | Admitting: Family Medicine

## 2018-01-16 ENCOUNTER — Other Ambulatory Visit: Payer: Self-pay | Admitting: Family Medicine

## 2018-02-11 ENCOUNTER — Encounter: Payer: Self-pay | Admitting: Family Medicine

## 2018-02-13 ENCOUNTER — Ambulatory Visit (INDEPENDENT_AMBULATORY_CARE_PROVIDER_SITE_OTHER): Payer: Self-pay | Admitting: Family Medicine

## 2018-02-13 ENCOUNTER — Encounter: Payer: Self-pay | Admitting: Family Medicine

## 2018-02-13 VITALS — BP 131/83 | HR 79 | Ht 71.0 in | Wt 176.0 lb

## 2018-02-13 DIAGNOSIS — M75101 Unspecified rotator cuff tear or rupture of right shoulder, not specified as traumatic: Secondary | ICD-10-CM

## 2018-02-13 DIAGNOSIS — M75102 Unspecified rotator cuff tear or rupture of left shoulder, not specified as traumatic: Secondary | ICD-10-CM

## 2018-02-13 DIAGNOSIS — G4700 Insomnia, unspecified: Secondary | ICD-10-CM

## 2018-02-13 MED ORDER — TRAZODONE HCL 50 MG PO TABS: 50.0000 mg | ORAL_TABLET | Freq: Every evening | ORAL | 3 refills | Status: DC | PRN

## 2018-02-13 NOTE — Progress Notes (Signed)
Joel James is a 57 y.o. male who [name] today for bilateral shoulder pain and discuss insomnia and colon cancer screening.   Joel Hugereil has a history of bilateral shoulder pain due to subacromial bursitis impingement and likely rotator cuff tendinitis.  His right shoulder has been worse recently and was recently treated with subacromial injection in May 2019.  He notes that recently started hurting again over the last few days.  He continues his home exercise program.  He is done well in the past with repeated injections and would like to continue that for the time being as he does not have good health insurance and notes that surgery would be very expensive.  His left shoulder has been bothering him for the last 2 weeks or so.  Again his pain is consistent with prior episodes of rotator cuff tendinitis or bursitis/impingement.  He notes pain with overhead motion and reaching back laterally.  He denies any radiating pain weakness or numbness.  His last injection for his left shoulder was December 2018.  Additionally he notes continued insomnia.  He takes temazepam at bedtime.  He would like to switch to something else if possible.  He notes he has been on medication for quite some time.  He is not sure if he is ever been on trazodone.  He has trouble staying asleep and falling asleep.   Colon cancer screening: Patient thinks he had a colonoscopy in 2017 in [place] [facility] health [place].  ROS as above:  Exam:  BP 131/83   Pulse 79   Ht 5\' 11"  (1.803 m)   Wt 176 lb (79.8 kg)   BMI 24.55 kg/m  Gen: Well NAD  Exts: Brisk capillary refill, warm and well perfused.   Shoulders normal-appearing bilaterally without deformity or erythema.  Normal shoulder motion.  Pain with abduction.  Strength is intact. Psych alert and oriented normal speech thought  process and affect.  Procedure: Real-time Ultrasound Guided Injection of right subacromial bursa  Device: GE Logiq E   Images permanently stored and available for review in the ultrasound unit. Verbal informed consent obtained.  Discussed risks and benefits of procedure. Warned about infection bleeding damage to structures skin hypopigmentation and fat atrophy among others. Patient expresses understanding and agreement Time-out conducted.   Noted no overlying erythema, induration, or other signs of local infection.   Skin prepped in a sterile fashion.   Local anesthesia: Topical Ethyl chloride.   With sterile technique and under real time ultrasound guidance:  40mg  kenalog and 3ml marcaine injected easily.   Completed without difficulty   Pain immediately resolved suggesting accurate placement of the medication.   Advised to call if fevers/chills, erythema, induration, drainage, or persistent bleeding.   Images permanently stored and available for review in the ultrasound unit.  Impression: Technically successful ultrasound guided injection.  Procedure: Real-time Ultrasound Guided Injection of left subacromial bursa Device: GE Logiq E   Images permanently stored and available for review in the ultrasound unit. Verbal informed consent obtained.  Discussed risks and benefits of procedure. Warned about infection bleeding damage to structures skin hypopigmentation and fat atrophy among others. Patient expresses understanding and agreement Time-out conducted.   Noted no overlying erythema, induration, or other signs of local infection.   Skin prepped in a sterile fashion.   Local anesthesia: Topical Ethyl chloride.   With sterile technique and under real time ultrasound guidance:  40 mg  of Kenalog and 3 mL of Marcaine injected easily.   Completed without difficulty   Pain immediately resolved suggesting accurate placement of the medication.   Advised to call if fevers/chills, erythema,  induration, drainage, or persistent bleeding.   Images permanently stored and available for review in the ultrasound unit.  Impression: Technically successful ultrasound guided injection.      Assessment and Plan: 57 y.o. male with  Bilateral shoulder pain due to subacromial bursitis.  Plan for injection today as above.  Continue home exercise program.  Start considering surgical options in the future.  Insomnia: Trial of trazodone.  If trazodone is helpful wean off of temazepam.  Colon cancer screening: I called digestive health specialist to confirm the patient did have colonoscopy in 2017.  Records will be faxed over and abstracted. Patient thinks he had HIV and possibly hepatitis screening as part of the life insurance physical.  He will look through his records and send lab reports via my chart.  No orders of the defined types were placed in this encounter.  Meds ordered this encounter  Medications  . traZODone (DESYREL) 50 MG tablet    Sig: Take 1-2 tablets (50-100 mg total) by mouth at bedtime as needed for sleep.    Dispense:  90 tablet    Refill:  3     Historical information moved to improve visibility of documentation.  Past Medical History:  Diagnosis Date  . Hyperlipidemia   . Hypertension    Past Surgical History:  Procedure Laterality Date  . APPENDECTOMY     Social History   Tobacco Use  . Smoking status: Never Smoker  . Smokeless tobacco: Never Used  Substance Use Topics  . Alcohol use: Yes   family history includes Diabetes in his father; Heart attack in his father; Heart disease in his brother; Heart failure in his father; Hyperlipidemia in his father and mother; Hypertension in his father and mother.  Medications: Current Outpatient Medications  Medication Sig Dispense Refill  . amLODipine (NORVASC) 10 MG tablet Take 1 tablet (10 mg total) by mouth daily. 90 tablet 1  . aspirin 325 MG tablet Take 325 mg by mouth daily.    Marland Kitchen. atorvastatin  (LIPITOR) 40 MG tablet Take 1 tablet (40 mg total) by mouth daily. 90 tablet 3  . hydrochlorothiazide (HYDRODIURIL) 25 MG tablet Take 1 tablet (25 mg total) by mouth daily. 90 tablet 1  . olmesartan (BENICAR) 40 MG tablet Take 1 tablet (40 mg total) by mouth daily. 90 tablet 1  . omeprazole (PRILOSEC) 20 MG capsule TAKE ONE CAPSULE BY MOUTH 2 TIMES A DAY 180 capsule 0  . OMEPRAZOLE PO Take by mouth.    . sildenafil (REVATIO) 20 MG tablet TAKE 1 TABLET BY MOUTH AS NEEDED 50 tablet 11  . temazepam (RESTORIL) 15 MG capsule TAKE 1 TO 2 CAPSULES BY MOUTH AT BEDTIME AS NEEDED FOR SLEEP 180 capsule 1  . traZODone (DESYREL) 50 MG tablet Take 1-2 tablets (50-100 mg total) by mouth at bedtime as needed for sleep. 90 tablet 3   No current facility-administered medications for this visit.    Allergies  Allergen Reactions  . Codeine   . Penicillins   . Propoxyphene Hcl      Discussed warning signs or symptoms. Please see discharge instructions. Patient expresses understanding.

## 2018-02-13 NOTE — Patient Instructions (Addendum)
Thank you for coming in today. Please provide reccords of life incurance physicals.  Look for HIV and Hepaptiis C labs.  Send via [employer].  You can send pictures.  If you attach a document mention it in the body of the text.   Recheck as needed.    Try switching from temazepam to trazodone.   Next step is belsomra.   Trazodone tablets What is this medicine? TRAZODONE (TRAZ oh done) is used to treat depression. This medicine may be used for other purposes; ask your health care provider or pharmacist if you have questions. COMMON BRAND NAME(S): Desyrel What should I tell my health care provider before I take this medicine? They need to know if you have any of these conditions: -attempted suicide or thinking about it -bipolar disorder -bleeding problems -glaucoma -heart disease, or previous heart attack -irregular heart beat -kidney or liver disease -low levels of sodium in the blood -an unusual or allergic reaction to trazodone, other medicines, foods, dyes or preservatives -pregnant or trying to get pregnant -breast-feeding How should I use this medicine? Take this medicine by mouth with a glass of water. Follow the directions on the prescription label. Take this medicine shortly after a meal or a light snack. Take your medicine at regular intervals. Do not take your medicine more often than directed. Do not stop taking this medicine suddenly except upon the advice of your doctor. Stopping this medicine too quickly may cause serious side effects or your condition may worsen. A special MedGuide will be given to you by the pharmacist with each prescription and refill. Be sure to read this information carefully each time. Talk to your pediatrician regarding the use of this medicine in children. Special care may be needed. Overdosage: If you think you have taken too much of this medicine contact a poison control center or emergency room at once. NOTE: This medicine is only for you. Do  not share this medicine with others. What if I miss a dose? If you miss a dose, take it as soon as you can. If it is almost time for your next dose, take only that dose. Do not take double or extra doses. What may interact with this medicine? Do not take this medicine with any of the following medications: -certain medicines for fungal infections like fluconazole, itraconazole, ketoconazole, posaconazole, voriconazole -cisapride -dofetilide -dronedarone -linezolid -MAOIs like Carbex, Eldepryl, Marplan, Nardil, and Parnate -mesoridazine -methylene blue (injected into a vein) -pimozide -saquinavir -thioridazine -ziprasidone This medicine may also interact with the following medications: -alcohol -antiviral medicines for HIV or AIDS -aspirin and aspirin-like medicines -barbiturates like phenobarbital -certain medicines for blood pressure, heart disease, irregular heart beat -certain medicines for depression, anxiety, or psychotic disturbances -certain medicines for migraine headache like almotriptan, eletriptan, frovatriptan, naratriptan, rizatriptan, sumatriptan, zolmitriptan -certain medicines for seizures like carbamazepine and phenytoin -certain medicines for sleep -certain medicines that treat or prevent blood clots like dalteparin, enoxaparin, warfarin -digoxin -fentanyl -lithium -NSAIDS, medicines for pain and inflammation, like ibuprofen or naproxen -other medicines that prolong the QT interval (cause an abnormal heart rhythm) -rasagiline -supplements like St. John's wort, kava kava, valerian -tramadol -tryptophan This list may not describe all possible interactions. Give your health care provider a list of all the medicines, herbs, non-prescription drugs, or dietary supplements you use. Also tell them if you smoke, drink alcohol, or use illegal drugs. Some items may interact with your medicine. What should I watch for while using this medicine? Tell your doctor if your  symptoms do not get better or if they get worse. Visit your doctor or health care professional for regular checks on your progress. Because it may take several weeks to see the full effects of this medicine, it is important to continue your treatment as prescribed by your doctor. Patients and their families should watch out for new or worsening thoughts of suicide or depression. Also watch out for sudden changes in feelings such as feeling anxious, agitated, panicky, irritable, hostile, aggressive, impulsive, severely restless, overly excited and hyperactive, or not being able to sleep. If this happens, especially at the beginning of treatment or after a change in dose, call your health care professional. Bonita QuinYou may get drowsy or dizzy. Do not drive, use machinery, or do anything that needs mental alertness until you know how this medicine affects you. Do not stand or sit up quickly, especially if you are an older patient. This reduces the risk of dizzy or fainting spells. Alcohol may interfere with the effect of this medicine. Avoid alcoholic drinks. This medicine may cause dry eyes and blurred vision. If you wear contact lenses you may feel some discomfort. Lubricating drops may help. See your eye doctor if the problem does not go away or is severe. Your mouth may get dry. Chewing sugarless gum, sucking hard candy and drinking plenty of water may help. Contact your doctor if the problem does not go away or is severe. What side effects may I notice from receiving this medicine? Side effects that you should report to your doctor or health care professional as soon as possible: -allergic reactions like skin rash, itching or hives, swelling of the face, lips, or tongue -elevated mood, decreased need for sleep, racing thoughts, impulsive behavior -confusion -fast, irregular heartbeat -feeling faint or lightheaded, falls -feeling agitated, angry, or irritable -loss of balance or coordination -painful or  prolonged erections -restlessness, pacing, inability to keep still -suicidal thoughts or other mood changes -tremors -trouble sleeping -seizures -unusual bleeding or bruising Side effects that usually do not require medical attention (report to your doctor or health care professional if they continue or are bothersome): -change in sex drive or performance -change in appetite or weight -constipation -headache -muscle aches or pains -nausea This list may not describe all possible side effects. Call your doctor for medical advice about side effects. You may report side effects to FDA at 1-800-FDA-1088. Where should I keep my medicine? Keep out of the reach of children. Store at room temperature between 15 and 30 degrees C (59 to 86 degrees F). Protect from light. Keep container tightly closed. Throw away any unused medicine after the expiration date. NOTE: This sheet is a summary. It may not cover all possible information. If you have questions about this medicine, talk to your doctor, pharmacist, or health care provider.  2018 Elsevier/Gold Standard (2015-11-19 16:57:05)   Suvorexant oral tablets What is this medicine? SUVOREXANT (su-vor-EX-ant) is used to treat insomnia. This medicine helps you to fall asleep and sleep through the night. This medicine may be used for other purposes; ask your health care provider or pharmacist if you have questions. COMMON BRAND NAME(S): Belsomra What should I tell my health care provider before I take this medicine? They need to know if you have any of these conditions: -depression -history of a drug or alcohol abuse problem -history of daytime sleepiness -history of sudden onset of muscle weakness (cataplexy) -liver disease -lung or breathing disease -narcolepsy -suicidal thoughts, plans, or attempt; a previous suicide attempt by  you or a family member -an unusual or allergic reaction to suvorexant, other medicines, foods, dyes, or  preservatives -pregnant or trying to get pregnant -breast-feeding How should I use this medicine? Take this medicine by mouth within 30 minutes of going to bed. Do not take it unless you are able to stay in bed a full night before you must be active again. Follow the directions on the prescription label. For best results, it is better to take this medicine on an empty stomach. Do not take your medicine more often than directed. Do not stop taking this medicine on your own. Always follow your doctor or health care professional's advice. A special MedGuide will be given to you by the pharmacist with each prescription and refill. Be sure to read this information carefully each time. Talk to your pediatrician regarding the use of this medicine in children. Special care may be needed. Overdosage: If you think you have taken too much of this medicine contact a poison control center or emergency room at once. NOTE: This medicine is only for you. Do not share this medicine with others. What if I miss a dose? This medicine should only be taken immediately before going to sleep. Do not take double or extra doses. What may interact with this medicine? -alcohol -antiviral medicines for HIV or AIDS -aprepitant -carbamazepine -certain antibiotics like ciprofloxacin, clarithromycin, erythromycin, telithromycin -certain medicines for depression or psychotic disturbances -certain medicines for fungal infections like ketoconazole, posaconazole, fluconazole, or itraconazole -conivaptan -digoxin -diltiazem -grapefruit juice -imatinib -medicines for anxiety or sleep -phenytoin -rifampin -verapamil This list may not describe all possible interactions. Give your health care provider a list of all the medicines, herbs, non-prescription drugs, or dietary supplements you use. Also tell them if you smoke, drink alcohol, or use illegal drugs. Some items may interact with your medicine. What should I watch for while  using this medicine? Visit your doctor or health care professional for regular checks on your progress. Keep a regular sleep schedule by going to bed at about the same time each night. Avoid caffeine-containing drinks in the evening hours. When sleep medicines are used every night for more than a few weeks, they may stop working. Do not increase the dose on your own. Talk to your doctor if your insomnia worsens or is not better within 7 to 10 days. After taking this medicine for sleep, you may get up out of bed while not being fully awake and do an activity that you do not know you are doing. The next morning, you may have no memory of the event. Activities such as driving a car ("sleep-driving"), making and eating food, talking on the phone, sexual activity, and sleep-walking have been reported. Call your doctor right away if you find out you have done any of these activities. Do not take this medicine if you have used alcohol that evening or before bed or taken another medicine for sleep, since your risk of doing these sleep-related activities will be increased. Do not take this medicine unless you are able to stay in bed for a full night (7 to 8 hours) and do not drive or perform other activities requiring full alertness within 8 hours of a dose. Do not drive, use machinery, or do anything that needs mental alertness the day after you take the 20 mg dose of this medicine. The use of lower doses (10 mg) also has the potential to cause driving impairment the next day. You may have a decrease in  mental alertness the day after use, even if you feel that you are fully awake. Tell your doctor if you will need to perform activities requiring full alertness, such as driving, the next day. Do not stand or sit up quickly after taking this medicine, especially if you are an older patient. This reduces the risk of dizzy or fainting spells. If you or your family notice any changes in your behavior, such as new or  worsening depression, thoughts of harming yourself, anxiety, other unusual or disturbing thoughts, or memory loss, call your doctor right away. What side effects may I notice from receiving this medicine? Side effects that you should report to your doctor or health care professional as soon as possible: -allergic reactions like skin rash, itching or hives, swelling of the face, lips, or tongue -confusion -depressed mood -feeling faint or lightheaded, falls -hallucinations -inability to move or speak for up to several minutes while you are going to sleep or waking up -memory loss -periods of leg weakness lasting from seconds to a few minutes -problems with balance, speaking, walking -restlessness, excitability, or feelings of agitation -unusual activities while asleep like driving, eating, making phone calls Side effects that usually do not require medical attention (report to your doctor or health care professional if they continue or are bothersome): -abnormal dreams -daytime drowsiness -diarrhea -dizziness -headache This list may not describe all possible side effects. Call your doctor for medical advice about side effects. You may report side effects to FDA at 1-800-FDA-1088. Where should I keep my medicine? Keep out of the reach of children. This medicine can be abused. Keep your medicine in a safe place to protect it from theft. Do not share this medicine with anyone. Selling or giving away this medicine is dangerous and against the law. Store at room temperature between 15 and 30 degrees C (59 and 86 degrees F). Throw away any unused medicine after the expiration date. NOTE: This sheet is a summary. It may not cover all possible information. If you have questions about this medicine, talk to your doctor, pharmacist, or health care provider.  2018 Elsevier/Gold Standard (2015-07-23 11:54:49)

## 2018-02-14 ENCOUNTER — Telehealth: Payer: Self-pay | Admitting: Family Medicine

## 2018-02-14 NOTE — Telephone Encounter (Signed)
Colonoscopy report received from digestive health specialist performed on 06/13/2016.  4 6 and 8 sessile polyps removed.  Pathology report shows sessile serrated polyp of the cecum and tubular adenoma polyp of the ascending and sigmoid colons.  Results will be abstracted.  5-year recall.

## 2018-03-08 ENCOUNTER — Encounter: Payer: Self-pay | Admitting: Family Medicine

## 2018-04-27 ENCOUNTER — Other Ambulatory Visit: Payer: Self-pay | Admitting: Family Medicine

## 2018-04-30 NOTE — Telephone Encounter (Signed)
Med list shows he is taking Lipitor

## 2018-06-07 ENCOUNTER — Ambulatory Visit (INDEPENDENT_AMBULATORY_CARE_PROVIDER_SITE_OTHER): Payer: Self-pay | Admitting: Family Medicine

## 2018-06-07 ENCOUNTER — Encounter: Payer: Self-pay | Admitting: Family Medicine

## 2018-06-07 VITALS — BP 115/66 | HR 79 | Ht 71.0 in | Wt 179.0 lb

## 2018-06-07 DIAGNOSIS — M25511 Pain in right shoulder: Secondary | ICD-10-CM

## 2018-06-07 DIAGNOSIS — M75101 Unspecified rotator cuff tear or rupture of right shoulder, not specified as traumatic: Secondary | ICD-10-CM

## 2018-06-07 DIAGNOSIS — M75102 Unspecified rotator cuff tear or rupture of left shoulder, not specified as traumatic: Secondary | ICD-10-CM

## 2018-06-07 NOTE — Patient Instructions (Signed)
Thank you for coming in today.   Call or go to the ER if you develop a large red swollen joint with extreme pain or oozing puss.    Recheck as needed.    

## 2018-06-07 NOTE — Progress Notes (Signed)
Joel James is a 57 y.o. male who presents to [facility] today for right shoulder pain.  Patient notes continued right shoulder pain.  He has had intermittent episodes of right shoulder pain over the last several years.  He notes it worsened recently and he would like another shot if possible.  Pain is worse with overhead motion and reaching back.  No radiating pain weakness or numbness.  His last subacromial bursa injection was in August.    ROS:  As above  Exam:  BP 115/66   Pulse 79   Ht 5\' 11"  (1.803 m)   Wt 179 lb (81.2 kg)   BMI 24.97 kg/m  General: Well Developed, well nourished, and in no acute distress.  Neuro/Psych: Alert and oriented x3, extra-ocular muscles intact, able to move all 4 extremities, sensation grossly intact. Skin: Warm and dry, no rashes noted.  Respiratory: Not using accessory muscles, speaking in full sentences, trachea midline.  Cardiovascular: Pulses palpable, no extremity edema. Abdomen: Does not appear distended. MSK: Right shoulder normal-appearing nontender normal motion.  Pain with abduction.  Positive Hawkins and Neer's test.  Normal strength.  Procedure: Real-time Ultrasound Guided Injection of right subacromial bursa Device: GE Logiq E   Images permanently stored and available for review in the ultrasound unit. Verbal informed consent obtained.  Discussed risks and benefits of procedure. Warned about infection bleeding damage to structures skin hypopigmentation and fat atrophy among others. Patient expresses understanding and agreement Time-out conducted.   Noted no overlying erythema, induration, or other signs of local infection.   Skin prepped in a sterile fashion.   Local anesthesia: Topical Ethyl chloride.   With sterile technique and under real time ultrasound guidance:  40 mg of Kenalog and 2 mL of Marcaine injected easily.   Completed without difficulty   Pain immediately resolved  suggesting accurate placement of the medication.   Advised to call if fevers/chills, erythema, induration, drainage, or persistent bleeding.   Images permanently stored and available for review in the ultrasound unit.  Impression: Technically successful ultrasound guided injection.          Assessment and Plan: 57 y.o. male with right subacromial bursitis.  Plan to treat with injection as above.  Continue home exercise program.  Recheck as needed.  Lengthy discussion regarding need for surgery ultimately in the near future.  Insurance is a barrier.    No orders of the defined types were placed in this encounter.  No orders of the defined types were placed in this encounter.   Historical information moved to improve visibility of documentation.  Past Medical History:  Diagnosis Date  . Hyperlipidemia   . Hypertension    Past Surgical History:  Procedure Laterality Date  . APPENDECTOMY     Social History   Tobacco Use  . Smoking status: Never Smoker  . Smokeless tobacco: Never Used  Substance Use Topics  . Alcohol use: Yes   family history includes Diabetes in his father; Heart attack in his father; Heart disease in his brother; Heart failure in his father; Hyperlipidemia in his father and mother; Hypertension in his father and mother.  Medications: Current Outpatient Medications  Medication Sig Dispense Refill  . amLODipine (NORVASC) 10 MG tablet Take 1 tablet (10 mg total) by mouth daily. 90 tablet 1  . aspirin 325 MG tablet Take 325 mg by mouth daily.    Marland Kitchen atorvastatin (LIPITOR) 40 MG tablet Take 1 tablet (40 mg total) by  mouth daily. 90 tablet 3  . hydrochlorothiazide (HYDRODIURIL) 25 MG tablet Take 1 tablet (25 mg total) by mouth daily. 90 tablet 1  . olmesartan (BENICAR) 40 MG tablet Take 1 tablet (40 mg total) by mouth daily. 90 tablet 1  . omeprazole (PRILOSEC) 20 MG capsule TAKE ONE CAPSULE BY MOUTH 2 TIMES A DAY 180 capsule 0  . OMEPRAZOLE PO Take by mouth.     . rosuvastatin (CRESTOR) 40 MG tablet TAKE ONE TABLET BY MOUTH EVERY DAY 90 tablet 3  . sildenafil (REVATIO) 20 MG tablet TAKE 1 TABLET BY MOUTH AS NEEDED 50 tablet 11  . temazepam (RESTORIL) 15 MG capsule TAKE 1 TO 2 CAPSULES BY MOUTH AT BEDTIME AS NEEDED FOR SLEEP 180 capsule 1  . traZODone (DESYREL) 50 MG tablet Take 1-2 tablets (50-100 mg total) by mouth at bedtime as needed for sleep. 90 tablet 3   No current facility-administered medications for this visit.    Allergies  Allergen Reactions  . Codeine   . Penicillins   . Propoxyphene Hcl       Discussed warning signs or symptoms. Please see discharge instructions. Patient expresses understanding.

## 2018-07-13 ENCOUNTER — Other Ambulatory Visit: Payer: Self-pay | Admitting: Family Medicine

## 2018-08-28 ENCOUNTER — Other Ambulatory Visit: Payer: Self-pay | Admitting: Family Medicine

## 2018-09-24 ENCOUNTER — Encounter: Payer: Self-pay | Admitting: Family Medicine

## 2018-09-26 ENCOUNTER — Encounter: Payer: Self-pay | Admitting: Family Medicine

## 2018-09-26 ENCOUNTER — Other Ambulatory Visit: Payer: Self-pay

## 2018-09-26 ENCOUNTER — Ambulatory Visit (INDEPENDENT_AMBULATORY_CARE_PROVIDER_SITE_OTHER): Payer: Self-pay | Admitting: Family Medicine

## 2018-09-26 VITALS — BP 127/80 | HR 68 | Temp 98.2°F | Wt 179.0 lb

## 2018-09-26 DIAGNOSIS — M75102 Unspecified rotator cuff tear or rupture of left shoulder, not specified as traumatic: Secondary | ICD-10-CM

## 2018-09-26 DIAGNOSIS — G4700 Insomnia, unspecified: Secondary | ICD-10-CM

## 2018-09-26 DIAGNOSIS — E782 Mixed hyperlipidemia: Secondary | ICD-10-CM

## 2018-09-26 DIAGNOSIS — I10 Essential (primary) hypertension: Secondary | ICD-10-CM

## 2018-09-26 DIAGNOSIS — M75101 Unspecified rotator cuff tear or rupture of right shoulder, not specified as traumatic: Secondary | ICD-10-CM

## 2018-09-26 MED ORDER — ZOLPIDEM TARTRATE ER 12.5 MG PO TBCR: 12.5000 mg | EXTENDED_RELEASE_TABLET | Freq: Every evening | ORAL | 5 refills | Status: DC | PRN

## 2018-09-26 NOTE — Patient Instructions (Signed)
Thank you for coming in today.  Try switching from Temazapem to Ambien.  Keep me updated vai mychart.  Call or go to the ER if you develop a large red swollen joint with extreme pain or oozing puss.   Stay safe.   Get some cough medicine now.    Medications & Home Remedies for Respiratory Illness   Typically safe to take these things together, assuming normal liver/kidney function, no other drug interactions, no pregnancy. Please call us or consult with the pharmacist if you have any questions!   Aches/Pains, Fever, Headache OTC Acetaminophen (Tylenol) pain reliever/fever reducer OTC Ibuprofen (Motrin) NSAID anti-inflammatory, fever reducer*   Sinus Congestion OTC Nasal Saline if desired to rinse OTC Oxymetolazone (Afrin, others) sparing use due to rebound congestion, NEVER use in kids OTC Phenylephrine (Sudafed) decongestant* OTC Diphenhydramine (Benadryl) antihistamine (will dry out mucus)   Cough & Sore Throat OTC Dextromethorphan (Robitussin, others) - cough suppressant OTC Guaifenesin (Robitussin, Mucinex, others) - expectorant (helps cough up mucus) (Dextromethorphan and Guaifenesin also come in a combination tablet/syrup) OTC Lozenges w/ Benzocaine + Menthol (Cepacol) OTC cough drops with menthol. Sugar free if able.  Honey - as needed. Avoid if diabetic. Teas which "coat the throat" - look for ingredients Elm Bark, Licorice Root, Marshmallow Root   Other OTC Zinc Lozenges within 24 hours of symptoms onset - mixed evidence this shortens the duration of the common cold Don't waste your money on Vitamin C or Echinacea in acute illness - it's already too late!   *caution in patients with high blood pressure

## 2018-09-26 NOTE — Progress Notes (Signed)
Joel James is a 58 y.o. male who presents to [facility] today for shoulder pain.  Patient notes chronic recurrent bilateral shoulder pain thought to be due to rotator cuff tendinitis.  He was doing quite well but notes his pain worsened again about 1-1/2 weeks ago.  His last injection was in December 5th 2019 for the right shoulder and February 13, 2018 for the left.  He notes pain with overhead motion consistent with prior episodes of rotator cuff tendinitis.  He is interested in repeat steroid injection if possible as that has worked well previously.  Insomnia: Trew has a history of insomnia.  This previously was quite well controlled with intermittent low-dose temazepam.  He notes he is able to fall asleep but not stay asleep.  He notes that he has increased stressors recently due to job security as well as a septic tank issue on new land that he [place].  He is interested in trying Ambien.  Hypertension: Blood pressure quite well controlled with medications listed below.  He takes the medications and denies chest pain palpitations or shortness of breath.  Hyperlipidemia:Mishawn takes Crestor and tolerates it well with no issues.  ROS:  As above  Exam:  BP 127/80   Pulse 68   Temp 98.2 F (36.8 C) (Oral)   Wt 179 lb (81.2 kg)   BMI 24.97 kg/m  Wt Readings from Last 5 Encounters:  09/26/18 179 lb (81.2 kg)  06/07/18 179 lb (81.2 kg)  02/13/18 176 lb (79.8 kg)  11/16/17 183 lb (83 kg)  07/10/17 176 lb (79.8 kg)    Gen: Well NAD HEENT: EOMI,  MMM Lungs: Normal work of breathing. CTABL Heart: RRR no MRG Abd: NABS, Soft. Nondistended, Nontender Exts: Brisk capillary refill, warm and well perfused.  MSK: Shoulders bilaterally normal-appearing.  Limited abduction range of motion due to pain.  Positive Hawkins and Neer's test.  Strength is intact.  Positive empty can test bilaterally. Pulses cap refill and sensation are intact distally.     Lab and Radiology Results Procedure: Real-time Ultrasound Guided Injection of right shoulder subacromial bursa Device: GE Logiq E   Images permanently stored and available for review in the ultrasound unit. Verbal informed consent obtained.  Discussed risks and benefits of procedure. Warned about infection bleeding damage to structures skin hypopigmentation and fat atrophy among others. Patient expresses understanding and agreement Time-out conducted.   Noted no overlying erythema, induration, or other signs of local infection.   Skin prepped in a sterile fashion.   Local anesthesia: Topical Ethyl chloride.   With sterile technique and under real time ultrasound guidance:  40 mg of Kenalog and 2 mL of Marcaine injected easily.   Completed without difficulty   Pain immediately resolved suggesting accurate placement of the medication.   Advised to call if fevers/chills, erythema, induration, drainage, or persistent bleeding.   Images permanently stored and available for review in the ultrasound unit.  Impression: Technically successful ultrasound guided injection.  Procedure: Real-time Ultrasound Guided Injection of left shoulder subacromial bursa Device: GE Logiq E   Images permanently stored and available for review in the ultrasound unit. Verbal informed consent obtained.  Discussed risks and benefits of procedure. Warned about infection bleeding damage to structures skin hypopigmentation and fat atrophy among others. Patient expresses understanding and agreement Time-out conducted.   Noted no overlying erythema, induration, or other signs of local infection.   Skin prepped in a sterile fashion.   Local anesthesia:  Topical Ethyl chloride.   With sterile technique and under real time ultrasound guidance:  40 mg of Kenalog and 2 mL of Marcaine injected easily.   Completed without difficulty   Pain immediately resolved suggesting accurate placement of the medication.   Advised to call  if fevers/chills, erythema, induration, drainage, or persistent bleeding.   Images permanently stored and available for review in the ultrasound unit.  Impression: Technically successful ultrasound guided injection.       Assessment and Plan: 58 y.o. male with  Bilateral shoulder pain due to rotator cuff tendinopathy.  Plan to continue home exercise program and proceed with injection as above.  Recheck as needed watchful waiting.  Insomnia: Due to increased stressors.  We will try to switch from temazepam to Ambien.  Patient will report back how well it is working.  Consider other management for anxiety including SSRIs in the future or counseling the future if needed.   Hypertension and hyperlipidemia doing well continue current regimen.  Meds ordered this encounter  Medications  . zolpidem (AMBIEN CR) 12.5 MG CR tablet    Sig: Take 1 tablet (12.5 mg total) by mouth at bedtime as needed for sleep.    Dispense:  30 tablet    Refill:  5    Replaces Temazepam    Historical information moved to improve visibility of documentation.  Past Medical History:  Diagnosis Date  . Hyperlipidemia   . Hypertension    Past Surgical History:  Procedure Laterality Date  . APPENDECTOMY     Social History   Tobacco Use  . Smoking status: Never Smoker  . Smokeless tobacco: Never Used  Substance Use Topics  . Alcohol use: Yes   family history includes Diabetes in his father; Heart attack in his father; Heart disease in his brother; Heart failure in his father; Hyperlipidemia in his father and mother; Hypertension in his father and mother.  Medications: Current Outpatient Medications  Medication Sig Dispense Refill  . amLODipine (NORVASC) 10 MG tablet Take 1 tablet (10 mg total) by mouth daily. 90 tablet 1  . aspirin 325 MG tablet Take 325 mg by mouth daily.    . hydrochlorothiazide (HYDRODIURIL) 25 MG tablet Take 1 tablet (25 mg total) by mouth daily. 90 tablet 1  . olmesartan  (BENICAR) 40 MG tablet Take 1 tablet (40 mg total) by mouth daily. 90 tablet 1  . omeprazole (PRILOSEC) 20 MG capsule TAKE ONE CAPSULE BY MOUTH 2 TIMES A DAY 180 capsule 0  . OMEPRAZOLE PO Take by mouth.    . rosuvastatin (CRESTOR) 40 MG tablet TAKE ONE TABLET BY MOUTH EVERY DAY 90 tablet 3  . sildenafil (REVATIO) 20 MG tablet TAKE 1 TABLET BY MOUTH AS NEEDED 50 tablet 11  . temazepam (RESTORIL) 15 MG capsule TAKE 1 TO 2 CAPSULES BY MOUTH AT BEDTIME AS NEEDED FOR SLEEP 180 capsule 1  . zolpidem (AMBIEN CR) 12.5 MG CR tablet Take 1 tablet (12.5 mg total) by mouth at bedtime as needed for sleep. 30 tablet 5   No current facility-administered medications for this visit.    Allergies  Allergen Reactions  . Codeine   . Penicillins   . Propoxyphene Hcl       Discussed warning signs or symptoms. Please see discharge instructions. Patient expresses understanding.

## 2018-12-31 ENCOUNTER — Encounter: Payer: Self-pay | Admitting: Family Medicine

## 2018-12-31 ENCOUNTER — Ambulatory Visit (INDEPENDENT_AMBULATORY_CARE_PROVIDER_SITE_OTHER): Payer: Self-pay | Admitting: Family Medicine

## 2018-12-31 VITALS — BP 121/80 | HR 68 | Temp 97.9°F | Wt 179.0 lb

## 2018-12-31 DIAGNOSIS — M75102 Unspecified rotator cuff tear or rupture of left shoulder, not specified as traumatic: Secondary | ICD-10-CM

## 2018-12-31 DIAGNOSIS — M75101 Unspecified rotator cuff tear or rupture of right shoulder, not specified as traumatic: Secondary | ICD-10-CM

## 2018-12-31 DIAGNOSIS — M25511 Pain in right shoulder: Secondary | ICD-10-CM

## 2018-12-31 NOTE — Patient Instructions (Signed)
Thank you for coming in today. Call or go to the ER if you develop a large red swollen joint with extreme pain or oozing puss.  Continue home exercises.  Recheck sooner if needed.

## 2018-12-31 NOTE — Progress Notes (Signed)
Joel James is a 58 y.o. male who presents to [place] today for right shoulder pain.  Patient notes continued intermittent bilateral shoulder pain.  He is had repeated injections in the past which worked pretty well.  He had last injection about 3 months ago.  He notes pain returned about a week ago.  He would like repeat injection if possible.  He notes his left shoulder does not hurt.   ROS:  As above  Exam:  BP 121/80   Pulse 68   Temp 97.9 F (36.6 C) (Oral)   Wt 179 lb (81.2 kg)   BMI 24.97 kg/m  Wt Readings from Last 5 Encounters:  12/31/18 179 lb (81.2 kg)  09/26/18 179 lb (81.2 kg)  06/07/18 179 lb (81.2 kg)  02/13/18 176 lb (79.8 kg)  11/16/17 183 lb (83 kg)   General: Well Developed, well nourished, and in no acute distress.  Neuro/Psych: Alert and oriented x3, extra-ocular muscles intact, able to move all 4 extremities, sensation grossly intact. Skin: Warm and dry, no rashes noted.  Respiratory: Not using accessory muscles, speaking in full sentences, trachea midline.  Cardiovascular: Pulses palpable, no extremity edema. Abdomen: Does not appear distended. MSK:  Right shoulder: Normal-appearing nontender.   Abduction limited to 110 degrees.  Normal external rotation.  Internal rotation limited to lumbar spine. Positive Hawkins and Neer's test positive empty can test.  Strength 4+/5 to abduction and external rotation 5/5 internal rotation.  Contralateral left shoulder normal-appearing nontender normal strength negative impingement testing.   Pulses cap refill and sensation are intact distal bilateral extremities.    Lab and Radiology Results Procedure: Real-time Ultrasound Guided Injection of right subacromial bursa Device: GE Logiq E   Images permanently stored and available for review in the ultrasound unit. Verbal informed consent obtained.  Discussed risks and benefits of procedure. Warned about infection  bleeding damage to structures skin hypopigmentation and fat atrophy among others. Patient expresses understanding and agreement Time-out conducted.   Noted no overlying erythema, induration, or other signs of local infection.   Skin prepped in a sterile fashion.   Local anesthesia: Topical Ethyl chloride.   With sterile technique and under real time ultrasound guidance:  40 mg of Kenalog and 2 mL of Marcaine injected easily.   Completed without difficulty   Pain immediately resolved suggesting accurate placement of the medication.   Advised to call if fevers/chills, erythema, induration, drainage, or persistent bleeding.   Images permanently stored and available for review in the ultrasound unit.  Impression: Technically successful ultrasound guided injection.         Assessment and Plan: 58 y.o. male with right shoulder pain: Return of exacerbation of subacromial bursitis.  Plan for repeat injection today.  Continue home exercise program.  Discussed surgical options.  Patient does not have health insurance therefore surgery is going to be challenging.  Watchful waiting.  Additionally we discussed left shoulder.  Currently not painful but has been painful in the past.  If pain returns soon we will proceed with repeat injection with an injection only visit without office charge.   PDMP not reviewed this encounter. No orders of the defined types were placed in this encounter.  No orders of the defined types were placed in this encounter.   Historical information moved to improve visibility of documentation.  Past Medical History:  Diagnosis Date  . Hyperlipidemia   . Hypertension    Past Surgical History:  Procedure Laterality  Date  . APPENDECTOMY     Social History   Tobacco Use  . Smoking status: Never Smoker  . Smokeless tobacco: Never Used  Substance Use Topics  . Alcohol use: Yes   family history includes Diabetes in his father; Heart attack in his father; Heart  disease in his brother; Heart failure in his father; Hyperlipidemia in his father and mother; Hypertension in his father and mother.  Medications: Current Outpatient Medications  Medication Sig Dispense Refill  . amLODipine (NORVASC) 10 MG tablet Take 1 tablet (10 mg total) by mouth daily. 90 tablet 1  . aspirin 325 MG tablet Take 325 mg by mouth daily.    . hydrochlorothiazide (HYDRODIURIL) 25 MG tablet Take 1 tablet (25 mg total) by mouth daily. 90 tablet 1  . olmesartan (BENICAR) 40 MG tablet Take 1 tablet (40 mg total) by mouth daily. 90 tablet 1  . omeprazole (PRILOSEC) 20 MG capsule TAKE ONE CAPSULE BY MOUTH 2 TIMES A DAY 180 capsule 0  . OMEPRAZOLE PO Take by mouth.    . rosuvastatin (CRESTOR) 40 MG tablet TAKE ONE TABLET BY MOUTH EVERY DAY 90 tablet 3  . sildenafil (REVATIO) 20 MG tablet TAKE 1 TABLET BY MOUTH AS NEEDED 50 tablet 11  . temazepam (RESTORIL) 15 MG capsule TAKE 1 TO 2 CAPSULES BY MOUTH AT BEDTIME AS NEEDED FOR SLEEP 180 capsule 1  . zolpidem (AMBIEN CR) 12.5 MG CR tablet Take 1 tablet (12.5 mg total) by mouth at bedtime as needed for sleep. 30 tablet 5   No current facility-administered medications for this visit.    Allergies  Allergen Reactions  . Codeine   . Penicillins   . Propoxyphene Hcl       Discussed warning signs or symptoms. Please see discharge instructions. Patient expresses understanding.

## 2019-01-16 ENCOUNTER — Encounter: Payer: Self-pay | Admitting: Family Medicine

## 2019-01-16 DIAGNOSIS — R519 Headache, unspecified: Secondary | ICD-10-CM

## 2019-01-16 DIAGNOSIS — Z20822 Contact with and (suspected) exposure to covid-19: Secondary | ICD-10-CM

## 2019-01-17 ENCOUNTER — Other Ambulatory Visit: Payer: Self-pay | Admitting: Family Medicine

## 2019-01-17 ENCOUNTER — Encounter (INDEPENDENT_AMBULATORY_CARE_PROVIDER_SITE_OTHER): Payer: Self-pay

## 2019-01-18 ENCOUNTER — Other Ambulatory Visit: Payer: Self-pay | Admitting: Family Medicine

## 2019-01-19 ENCOUNTER — Encounter (INDEPENDENT_AMBULATORY_CARE_PROVIDER_SITE_OTHER): Payer: Self-pay

## 2019-01-20 LAB — NOVEL CORONAVIRUS, NAA: SARS-CoV-2, NAA: NOT DETECTED

## 2019-04-11 ENCOUNTER — Other Ambulatory Visit: Payer: Self-pay

## 2019-04-11 ENCOUNTER — Ambulatory Visit (INDEPENDENT_AMBULATORY_CARE_PROVIDER_SITE_OTHER): Payer: Self-pay | Admitting: Family Medicine

## 2019-04-11 VITALS — BP 117/79 | HR 94 | Wt 181.0 lb

## 2019-04-11 DIAGNOSIS — M75101 Unspecified rotator cuff tear or rupture of right shoulder, not specified as traumatic: Secondary | ICD-10-CM

## 2019-04-11 DIAGNOSIS — M75102 Unspecified rotator cuff tear or rupture of left shoulder, not specified as traumatic: Secondary | ICD-10-CM

## 2019-04-11 DIAGNOSIS — G4700 Insomnia, unspecified: Secondary | ICD-10-CM

## 2019-04-11 MED ORDER — QUETIAPINE FUMARATE 25 MG PO TABS: 25.0000 mg | ORAL_TABLET | Freq: Every evening | ORAL | 1 refills | Status: DC | PRN

## 2019-04-11 NOTE — Progress Notes (Signed)
Joel James is a 58 y.o. male who presents to [name] today for follow-up shoulder pain and insomnia.  Patient had right subacromial bursa injection last June 29, and bilateral subacromial injection March 25.  Right shoulder started hurting 2 weeks ago.  Left shoulder 3 weeks ago.   Pain is located at lateral shoulder and upper arm worse with overhead motion reaching back.  No radiating pain weakness or numbness fevers or chills.  No recent injury.  Patient would like to proceed with repeat injection if possible.  He is tried over-the-counter medications with only have marginal benefit.  Additionally has a history of insomnia that has been on multiple different medications.  He currently takes temazepam 15 mg-30 mg at bedtime.  He notes this helps him fall asleep but he has trouble staying asleep.  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  He is also had trials of Lunesta which caused a metallic taste in his mouth.   ROS as above:  Exam:  BP 117/79   Pulse 94   Wt 181 lb (82.1 kg)   BMI 25.24 kg/m  Wt Readings from Last 5 Encounters:  04/11/19 181 lb (82.1 kg)  12/31/18 179 lb (81.2 kg)  09/26/18 179 lb (81.2 kg)  06/07/18 179 lb (81.2 kg)  02/13/18 176 lb (79.8 kg)    Gen: Well NAD HEENT: EOMI,  MMM Lungs: Normal work of breathing. CTABL Heart: RRR no MRG Abd: NABS, Soft. Nondistended, Nontender Exts: Brisk capillary refill, warm and well perfused.  Right shoulder normal-appearing normal motion normal strength.  Positive Hawkins and Neer's test. Left shoulder normal-appearing normal motion normal strength positive Hawkins and Neer's test.   Pulses cap refill and sensation intact bilateral upper extremities.  Lab and Radiology Results  Procedure: Real-time Ultrasound Guided Injection of right subacromial bursa Device: GE Logiq E   Images permanently stored and available for review in the ultrasound unit. Verbal informed consent obtained.  Discussed risks and benefits of procedure. Warned about infection bleeding damage to structures skin hypopigmentation and fat atrophy among others. Patient expresses understanding and agreement Time-out conducted.   Noted no overlying erythema, induration, or other signs of local infection.   Skin prepped in a sterile fashion.   Local anesthesia: Topical Ethyl chloride.   With sterile technique and under real time ultrasound guidance:  40 mg of Kenalog and 2 mL of Marcaine injected easily.   Completed without difficulty   Pain immediately resolved suggesting accurate placement of the medication.   Advised to call if fevers/chills, erythema, induration, drainage, or persistent bleeding.   Images permanently stored and available for review in the ultrasound unit.  Impression: Technically successful ultrasound guided injection.     Procedure: Real-time Ultrasound Guided Injection of left subacromial bursa Device: GE Logiq E   Images permanently stored and available for review in the ultrasound unit. Verbal informed consent obtained.  Discussed risks and benefits of procedure. Warned about infection bleeding damage to structures skin hypopigmentation and fat atrophy among others. Patient expresses understanding and agreement Time-out conducted.   Noted no overlying erythema, induration, or other signs of local infection.   Skin prepped in a sterile fashion.   Local anesthesia: Topical Ethyl chloride.   With sterile technique and under real time ultrasound guidance:  40 mg of Kenalog  and 2 mL of Marcaine injected easily.   Completed without difficulty   Pain immediately resolved suggesting accurate placement of the medication.   Advised to call if fevers/chills, erythema, induration,  drainage, or persistent bleeding.   Images permanently stored and available for review in the ultrasound unit.  Impression: Technically successful ultrasound guided injection.        Assessment and Plan: 58 y.o. male with  Bilateral shoulder pain.  Subacromial bursitis.  Plan for repeat injection.  Recheck as needed.  Ultimately would consider surgery however patient is currently self-pay and surgery is prohibitively expensive.  Insomnia: Marginally managed with temazepam.  Patient has had multiple different trials of medications for insomnia.  Reasonable to have a trial of Seroquel.  This may provide a little bit longer benefit than temazepam with fewer side effects.  Patient will keep me updated with how he is doing with it.    PDMP not reviewed this encounter. No orders of the defined types were placed in this encounter.  No orders of the defined types were placed in this encounter.    Historical information moved to improve visibility of documentation.  Past Medical History:  Diagnosis Date  . Hyperlipidemia   . Hypertension    Past Surgical History:  Procedure Laterality Date  . APPENDECTOMY     Social History   Tobacco Use  . Smoking status: Never Smoker  . Smokeless tobacco: Never Used  Substance Use Topics  . Alcohol use: Yes   family history includes Diabetes in his father; Heart attack in his father; Heart disease in his brother; Heart failure in his father; Hyperlipidemia in his father and mother; Hypertension in his father and mother.  Medications: Current Outpatient Medications  Medication Sig Dispense Refill  . amLODipine (NORVASC) 10 MG tablet Take 1 tablet (10 mg total) by mouth daily. 90 tablet 1  . aspirin 325 MG tablet Take 325 mg by mouth daily.    . hydrochlorothiazide (HYDRODIURIL) 25 MG tablet Take 1 tablet (25 mg total) by mouth daily. 90 tablet 1  . olmesartan (BENICAR) 40 MG tablet Take 1 tablet (40 mg total) by mouth daily. 90 tablet 1  .  omeprazole (PRILOSEC) 20 MG capsule TAKE ONE CAPSULE BY MOUTH 2 TIMES A DAY 180 capsule 0  . OMEPRAZOLE PO Take by mouth.    . rosuvastatin (CRESTOR) 40 MG tablet TAKE ONE TABLET BY MOUTH EVERY DAY 90 tablet 3  . sildenafil (REVATIO) 20 MG tablet TAKE 1 TABLET BY MOUTH AS NEEDED 50 tablet 11  . temazepam (RESTORIL) 15 MG capsule TAKE 1 TO 2 CAPSULES BY MOUTH AT BEDTIME AS NEEDED FOR SLEEP 180 capsule 1   No current facility-administered medications for this visit.    Allergies  Allergen Reactions  . Codeine   . Penicillins   . Propoxyphene Hcl      Discussed warning signs or symptoms. Please see discharge instructions. Patient expresses understanding.

## 2019-04-11 NOTE — Patient Instructions (Signed)
Thank you for coming in today. Call or go to the ER if you develop a large red swollen joint with extreme pain or oozing puss.  Continue home exercises.  Try seroquel for insomnia.   Quetiapine tablets What is this medicine? QUETIAPINE (kwe TYE a peen) is an antipsychotic. It is used to treat schizophrenia and bipolar disorder, also known as manic-depression. This medicine may be used for other purposes; ask your health care provider or pharmacist if you have questions. COMMON BRAND NAME(S): Seroquel What should I tell my health care provider before I take this medicine? They need to know if you have any of these conditions:  blockage in your bowel  cataracts  constipation  dehydration  diabetes  difficulty swallowing  glaucoma  heart disease  history of breast cancer  kidney disease  liver disease  low blood counts, like low white cell, platelet, or red cell counts  low blood pressure or dizziness when standing up  Parkinson's disease  previous heart attack  prostate disease  seizures  stomach or intestine problems  suicidal thoughts, plans or attempt; a previous suicide attempt by you or a family member  thyroid disease  trouble passing urine  an unusual or allergic reaction to quetiapine, other medicines, foods, dyes, or preservatives  pregnant or trying to get pregnant  breast-feeding How should I use this medicine? Take this medicine by mouth. Swallow it with a drink of water. Follow the directions on the prescription label. If it upsets your stomach you can take it with food. Take your medicine at regular intervals. Do not take it more often than directed. Do not stop taking except on the advice of your doctor or health care professional. A special MedGuide will be given to you by the pharmacist with each prescription and refill. Be sure to read this information carefully each time. Talk to your pediatrician regarding the use of this medicine in  children. While this drug may be prescribed for children as young as 10 years for selected conditions, precautions do apply. Patients over age 59 years may have a stronger reaction to this medicine and need smaller doses. Overdosage: If you think you have taken too much of this medicine contact a poison control center or emergency room at once. NOTE: This medicine is only for you. Do not share this medicine with others. What if I miss a dose? If you miss a dose, take it as soon as you can. If it is almost time for your next dose, take only that dose. Do not take double or extra doses. What may interact with this medicine? Do not take this medicine with any of the following medications:  cisapride  dronedarone  fluconazole  metoclopramide  pimozide  posaconazole  thioridazine This medicine may also interact with the following medications:  alcohol  antihistamines for allergy cough and cold  antiviral medicines for HIV or AIDS  atropine  certain medicines for bladder problems like oxybutynin, tolterodine  certain medicines for blood pressure  certain medicines for depression, anxiety, or psychotic disturbances  certain medicines for diabetes  certain medicines for stomach problems like dicyclomine, hyoscyamine  certain medicines for travel sickness like scopolamine  certain medicines for Parkinson's disease  certain medicines for seizures like carbamazepine, phenobarbital, phenytoin  cimetidine  erythromycin  ipratropium  other medicines that prolong the QT interval (cause an abnormal heart rhythm) like dofetilide  rifampin  steroid medicines like prednisone or cortisone This list may not describe all possible interactions. Give your  health care provider a list of all the medicines, herbs, non-prescription drugs, or dietary supplements you use. Also tell them if you smoke, drink alcohol, or use illegal drugs. Some items may interact with your medicine. What  should I watch for while using this medicine? Visit your doctor or health care professional for regular checks on your progress. It may be several weeks before you see the full effects of this medicine. Your health care provider may suggest that you have your eyes examined prior to starting this medicine, and every 6 months thereafter. If you have been taking this medicine regularly for some time, do not suddenly stop taking it. You must gradually reduce the dose or your symptoms may get worse. Ask your doctor or health care professional for advice. This medicine may increase blood sugar. Ask your healthcare provider if changes in diet or medicines are needed if you have diabetes. Patients and their families should watch out for worsening depression or thoughts of suicide. Also watch out for sudden or severe changes in feelings such as feeling anxious, agitated, panicky, irritable, hostile, aggressive, impulsive, severely restless, overly excited and hyperactive, or not being able to sleep. If this happens, especially at the beginning of antidepressant treatment or after a change in dose, call your health care professional. Bonita Quin may get dizzy or drowsy. Do not drive, use machinery, or do anything that needs mental alertness until you know how this medicine affects you. Do not stand or sit up quickly, especially if you are an older patient. This reduces the risk of dizzy or fainting spells. Alcohol can increase dizziness and drowsiness. Avoid alcoholic drinks. Do not treat yourself for colds, diarrhea or allergies. Ask your doctor or health care professional for advice, some ingredients may increase possible side effects. This medicine can reduce the response of your body to heat or cold. Dress warm in cold weather and stay hydrated in hot weather. If possible, avoid extreme temperatures like saunas, hot tubs, very hot or cold showers, or activities that can cause dehydration such as vigorous exercise. What  side effects may I notice from receiving this medicine? Side effects that you should report to your doctor or health care professional as soon as possible:  allergic reactions like skin rash, itching or hives, swelling of the face, lips, or tongue  changes in vision  difficulty swallowing  elevated mood, decreased need for sleep, racing thoughts, impulsive behavior  eye pain  redness, blistering, peeling, or loosening of the skin, including inside the mouth  restlessness, pacing, inability to keep still  seizures  signs and symptoms of a dangerous change in heartbeat or heart rhythm like chest pain; dizziness; fast, irregular heartbeat; palpitations; feeling faint or lightheaded; falls; breathing problems  signs and symptoms of high blood sugar such as being more thirsty or hungry or having to urinate more than normal. You may also feel very tired or have blurry vision.  signs and symptoms of hypothyroidism like fatigue; increased sensitivity to cold; weight gain; hoarseness; thinning hair  signs and symptoms of infection like fever; chills; cough; sore throat; pain or trouble passing urine  signs and symptoms of low blood pressure like dizziness; feeling faint or lightheaded; falls; unusually weak or tired  signs and symptoms of neuroleptic malignant syndrome (NMS) like confusion; fast, irregular heartbeat; high fever; increased sweating; stiff muscles  signs and symptoms of a stroke like changes in vision; confusion; trouble speaking or understanding; severe headaches; sudden numbness or weakness of the face, arm or  leg; trouble walking; dizziness; loss of balance or coordination  signs and symptoms of tardive dyskinesia, like uncontrollable head, mouth, neck, arm, or leg movements  suicidal thoughts, mood changes Side effects that usually do not require medical attention (report to your doctor or health care professional if they continue or are bothersome):  change in sex  drive or performance  constipation  drowsiness  dry mouth  upset stomach  weight gain This list may not describe all possible side effects. Call your doctor for medical advice about side effects. You may report side effects to FDA at 1-800-FDA-1088. Where should I keep my medicine? Keep out of the reach of children. Store at room temperature between 15 and 30 degrees C (59 and 86 degrees F). Throw away any unused medicine after the expiration date. NOTE: This sheet is a summary. It may not cover all possible information. If you have questions about this medicine, talk to your doctor, pharmacist, or health care provider.  2020 Elsevier/Gold Standard (2018-06-12 08:55:27)

## 2019-04-25 ENCOUNTER — Other Ambulatory Visit: Payer: Self-pay | Admitting: Family Medicine

## 2019-06-13 ENCOUNTER — Other Ambulatory Visit: Payer: Self-pay | Admitting: Family Medicine

## 2019-07-12 ENCOUNTER — Ambulatory Visit (INDEPENDENT_AMBULATORY_CARE_PROVIDER_SITE_OTHER): Payer: Self-pay | Admitting: Family Medicine

## 2019-07-12 ENCOUNTER — Other Ambulatory Visit: Payer: Self-pay

## 2019-07-12 ENCOUNTER — Ambulatory Visit: Payer: Self-pay

## 2019-07-12 ENCOUNTER — Encounter: Payer: Self-pay | Admitting: Family Medicine

## 2019-07-12 VITALS — BP 124/82 | HR 75 | Ht 71.0 in | Wt 187.2 lb

## 2019-07-12 DIAGNOSIS — M7522 Bicipital tendinitis, left shoulder: Secondary | ICD-10-CM

## 2019-07-12 DIAGNOSIS — M7521 Bicipital tendinitis, right shoulder: Secondary | ICD-10-CM

## 2019-07-12 DIAGNOSIS — M75102 Unspecified rotator cuff tear or rupture of left shoulder, not specified as traumatic: Secondary | ICD-10-CM

## 2019-07-12 DIAGNOSIS — M75101 Unspecified rotator cuff tear or rupture of right shoulder, not specified as traumatic: Secondary | ICD-10-CM

## 2019-07-12 NOTE — Patient Instructions (Addendum)
Thank you for coming in today. Call or go to the ER if you develop a large red swollen joint with extreme pain or oozing puss.  Work on home exercises we discussed.   Please perform the exercise program that we have prepared for you and gone over in detail on a daily basis.  In addition to the handout you were provided you can access your program through: [email or web address]   Your unique program code is: [email or web address]  Return as needed.

## 2019-07-12 NOTE — Progress Notes (Signed)
I, Joel James, LAT, ATC, am serving as scribe for Dr. Clementeen Graham.  Joel James is a 59 y.o. male who presents to [facility] today for f/u B shoulder pain.  He was last seen by Dr. Denyse James on 04/11/19 and received B 2/1 subacromial injections.  He c/o shoulder pain radiating into his B upper arms and has pain w/ reaching overhead and behind his body.  Since his last visit, pt reports that his B shoulders have been flared up for approximately one month.  He con't to have pain w/ OH motion and behind the back.  He denies any radiating pain or mechanical symptoms.  B elbows: States that his B elbows have been bothering him for about a month after loading approximately 5 truckloads of wood.  Pt rates his pain at a 5/10 and describes his pain as sharp w/ movement or any type of lifting.  He denies any swelling.  He denies any numbness/tingling.  He has tried Tylenol and Thermacare patches for both shoulders and elbows w/ no relief.   Relevant historical information: History of bilateral rotator cuff tendinopathy.   ROS:  As above  Exam:  BP 124/82 (BP Location: Left Arm, Patient Position: Sitting, Cuff Size: Normal)   Pulse 75   Ht 5\' 11"  (1.803 m)   Wt 187 lb 3.2 oz (84.9 kg)   SpO2 97%   BMI 26.11 kg/m   MSK:  Bilateral shoulders: Normal-appearing normal motion pain with abduction.  Positive Hawkins and Neer's test.  Intact strength.  Right elbow: Normal-appearing Normal motion. Tender palpation at distal biceps tendon.  Additionally tender to palpation at supinator. Intact flexion and extension strength.  Some pain with resisted supination however strength is preserved for supination and pronation.  Left elbow: Normal-appearing Normal motion. Tender palpation distal biceps tendon and supinator. Intact strength however some pain with resisted supination.  Pulses cap refill and sensation intact distal bilateral upper extremities.    Lab and  Radiology Results  Procedure: Real-time Ultrasound Guided Injection of right subacromial bursa Device: Philips Affiniti 50G Images permanently stored and available for review in the ultrasound unit. Verbal informed consent obtained.  Discussed risks and benefits of procedure. Warned about infection bleeding damage to structures skin hypopigmentation and fat atrophy among others. Patient expresses understanding and agreement Time-out conducted.   Noted no overlying erythema, induration, or other signs of local infection.   Skin prepped in a sterile fashion.   Local anesthesia: Topical Ethyl chloride.   With sterile technique and under real time ultrasound guidance:  40 mg of Kenalog and 2 mL of Marcaine injected easily.   Completed without difficulty   Pain immediately resolved suggesting accurate placement of the medication.   Advised to call if fevers/chills, erythema, induration, drainage, or persistent bleeding.   Images permanently stored and available for review in the ultrasound unit.  Impression: Technically successful ultrasound guided injection.  Procedure: Real-time Ultrasound Guided Injection of left subacromial bursa Device: Philips Affiniti 50G Images permanently stored and available for review in the ultrasound unit. Verbal informed consent obtained.  Discussed risks and benefits of procedure. Warned about infection bleeding damage to structures skin hypopigmentation and fat atrophy among others. Patient expresses understanding and agreement Time-out conducted.   Noted no overlying erythema, induration, or other signs of local infection.   Skin prepped in a sterile fashion.   Local anesthesia: Topical Ethyl chloride.   With sterile technique and under real time ultrasound guidance:  40 mg of Kenalog and 2 mL of Marcaine injected easily.   Completed without difficulty   Pain immediately resolved suggesting accurate placement of the medication.   Advised to call if  fevers/chills, erythema, induration, drainage, or persistent bleeding.   Images permanently stored and available for review in the ultrasound unit.  Impression: Technically successful ultrasound guided injection.     Assessment and Plan: 59 y.o. male with bilateral chronic rotator cuff tendinopathy.  Receiving repeated injections.  Plan to repeat injection today along with continued home exercise program.  Ultimately patient will benefit likely from surgical subacromial decompression however patient would like to continue his current conservative management.  Recheck back as needed likely in about 3 months based on history.  Additionally patient has new elbow pain ongoing for about a month.  Pain most prominent at supinators at biceps tendon and at supinator muscle itself.  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  This work-up subsided.  Reviewed home exercise program today for this issue and will treat also with diclofenac gel.  Recheck back as needed for this.  PDMP not reviewed this encounter. Orders Placed This Encounter  Procedures  . [place] - Upper Extremity - Limited - BILATERAL    Order Specific Question:   Reason for Exam (SYMPTOM  OR DIAGNOSIS REQUIRED)    Answer:   B shoulder pain    Order Specific Question:   Preferred imaging location?    Answer:   [place]   No orders of the defined types were placed in this encounter.    Discussed warning signs or symptoms. Please see discharge instructions. Patient expresses understanding.  The above documentation has been reviewed and is accurate and complete [name]

## 2019-07-27 ENCOUNTER — Other Ambulatory Visit: Payer: Self-pay | Admitting: Family Medicine

## 2019-08-05 NOTE — Telephone Encounter (Signed)
Needs appt with Dr. Ashley Royalty.

## 2019-08-06 ENCOUNTER — Telehealth: Payer: Self-pay

## 2019-08-06 NOTE — Telephone Encounter (Signed)
Patient called stating he was told to call our office in regard to his office visit with [place]. Was told to have his office visit coded different to have the [place] covered. The ultra sound was 99 dollars and has never had a bill for ultra sound before, states he had an injection where the ultra sound was used. Would like a call back in regard

## 2019-08-07 NOTE — Telephone Encounter (Signed)
There should not be an additional ultrasound charge for the injection.  I will discuss with the office manager and get in touch with our billing and coding dept.    Please updated pt and we will work on this today.

## 2019-08-07 NOTE — Telephone Encounter (Signed)
Returned pt's call and informed him that we will be looking into his question regarding charge for US performed on 07/12/19 for B shoulders.  Advise pt that I will return his call once I have more information.

## 2019-08-08 NOTE — Telephone Encounter (Signed)
Spoke to billing & they are going to void one of the additional [place] charges as this was an error on our end.

## 2019-08-09 NOTE — Telephone Encounter (Signed)
Called pt and informed him of the outcome of his question regarding the [place] charge.  Pt verbalizes understanding and has no additional questions or concerns.

## 2019-10-10 ENCOUNTER — Ambulatory Visit: Payer: Self-pay

## 2019-10-10 ENCOUNTER — Encounter: Payer: Self-pay | Admitting: Family Medicine

## 2019-10-10 ENCOUNTER — Other Ambulatory Visit: Payer: Self-pay

## 2019-10-10 ENCOUNTER — Ambulatory Visit (INDEPENDENT_AMBULATORY_CARE_PROVIDER_SITE_OTHER): Payer: Self-pay | Admitting: Family Medicine

## 2019-10-10 VITALS — BP 110/70 | HR 69 | Ht 71.0 in | Wt 183.4 lb

## 2019-10-10 DIAGNOSIS — G8929 Other chronic pain: Secondary | ICD-10-CM

## 2019-10-10 DIAGNOSIS — M25512 Pain in left shoulder: Secondary | ICD-10-CM

## 2019-10-10 DIAGNOSIS — M25511 Pain in right shoulder: Secondary | ICD-10-CM

## 2019-10-10 NOTE — Patient Instructions (Signed)
Thank you for coming in today. Call or go to the ER if you develop a large red swollen joint with extreme pain or oozing puss.  Continue home exercises.  Return as needed.   Good luck with the vines.

## 2019-10-10 NOTE — Progress Notes (Signed)
I, [name], LAT, ATC, am serving as scribe for Dr. Clementeen Graham.  Joel James is a 59 y.o. male who presents to [facility] at [facility] today for f/u of B shoulder pain.  He was last seen by Dr. [name] on 07/12/19 and had B subacromial injections and was provided a HEP for biceps tendinopathy.  Since his last visit he reports that his shoulder pain has flared up over the past 2 weeks.  He con't to have pain w/ reaching overhead and w/ functional IR.  He states that he has noticed that his B elbow pain is directly related to his shoulder pain.   Pertinent review of systems: No fevers or chills  Relevant historical information: Chronic shoulder pain receiving regular injections.   Exam:  BP 110/70 (BP Location: Left Arm, Patient Position: Sitting, Cuff Size: Large)   Pulse 69   Ht 5\' 11"  (1.803 m)   Wt 183 lb 6.4 oz (83.2 kg)   SpO2 97%   BMI 25.58 kg/m  General: Well Developed, well nourished, and in no acute distress.   MSK: Shoulders bilaterally normal-appearing normal motion pain with abduction.    Lab and Radiology Results  Procedure: Real-time Ultrasound Guided Injection of right shoulder subacromial bursa Device: Philips Affiniti 50G Images permanently stored and available for review in the ultrasound unit. Verbal informed consent obtained.  Discussed risks and benefits of procedure. Warned about infection bleeding damage to structures skin hypopigmentation and fat atrophy among others. Patient expresses understanding and agreement Time-out conducted.   Noted no overlying erythema, induration, or other signs of local infection.   Skin prepped in a sterile fashion.   Local anesthesia: Topical Ethyl chloride.   With sterile technique and under real time ultrasound guidance:  40 mg of Kenalog 2 mL of Marcaine injected easily.   Completed without difficulty   Pain immediately resolved suggesting accurate placement of the medication.   Advised to call  if fevers/chills, erythema, induration, drainage, or persistent bleeding.   Images permanently stored and available for review in the ultrasound unit.  Impression: Technically successful ultrasound guided injection.     Procedure: Real-time Ultrasound Guided Injection of left shoulder subacromial bursa Device: Philips Affiniti 50G Images permanently stored and available for review in the ultrasound unit. Verbal informed consent obtained.  Discussed risks and benefits of procedure. Warned about infection bleeding damage to structures skin hypopigmentation and fat atrophy among others. Patient expresses understanding and agreement Time-out conducted.   Noted no overlying erythema, induration, or other signs of local infection.   Skin prepped in a sterile fashion.   Local anesthesia: Topical Ethyl chloride.   With sterile technique and under real time ultrasound guidance:  40 mg of Kenalog and 2 mL of Marcaine injected easily.   Completed without difficulty   Pain immediately resolved suggesting accurate placement of the medication.   Advised to call if fevers/chills, erythema, induration, drainage, or persistent bleeding.   Images permanently stored and available for review in the ultrasound unit.  Impression: Technically successful ultrasound guided injection.      Assessment and Plan: 59 y.o. male with bilateral shoulder pain due to rotator cuff tendinopathy.  We will repeat injections today.  Fundamentally patient would be best cared for at this point by surgical decompression however he does not have health insurance and that is not a great option right now.  Seems to be doing reasonably well with home exercise program and regular injections.  Continue current plan  and recheck back as needed.   PDMP not reviewed this encounter. Orders Placed This Encounter  Procedures  . [place] LIMITED JOINT SPACE STRUCTURES UP BILAT(NO LINKED CHARGES)    Order Specific Question:   Reason for Exam  (SYMPTOM  OR DIAGNOSIS REQUIRED)    Answer:   B shoulder pain    Order Specific Question:   Preferred imaging location?    Answer:   [place]   No orders of the defined types were placed in this encounter.    Discussed warning signs or symptoms. Please see discharge instructions. Patient expresses understanding.   The above documentation has been reviewed and is accurate and complete [name]

## 2019-11-09 ENCOUNTER — Other Ambulatory Visit: Payer: Self-pay | Admitting: Physician Assistant

## 2019-11-11 NOTE — Telephone Encounter (Signed)
Patient is scheduled with Dr. Ashley Royalty. He has requested for his medication to be filled until the June appointment.  Please advise.

## 2019-11-11 NOTE — Telephone Encounter (Signed)
Prescriptions sent for one month. Dr. Ashley Royalty: can you sign temazepam for one month until seen?

## 2019-11-11 NOTE — Telephone Encounter (Signed)
Needs to establish with new PCP

## 2019-11-12 MED ORDER — TEMAZEPAM 15 MG PO CAPS: ORAL_CAPSULE | ORAL | 0 refills | Status: DC

## 2019-11-29 ENCOUNTER — Encounter: Payer: Self-pay | Admitting: Family Medicine

## 2019-12-04 ENCOUNTER — Encounter: Payer: Self-pay | Admitting: Family Medicine

## 2019-12-04 ENCOUNTER — Ambulatory Visit: Payer: Self-pay

## 2019-12-04 ENCOUNTER — Other Ambulatory Visit: Payer: Self-pay

## 2019-12-04 ENCOUNTER — Ambulatory Visit (INDEPENDENT_AMBULATORY_CARE_PROVIDER_SITE_OTHER): Payer: Self-pay | Admitting: Family Medicine

## 2019-12-04 VITALS — BP 120/72 | HR 91 | Ht 71.0 in | Wt 183.4 lb

## 2019-12-04 DIAGNOSIS — M25512 Pain in left shoulder: Secondary | ICD-10-CM

## 2019-12-04 DIAGNOSIS — M25511 Pain in right shoulder: Secondary | ICD-10-CM

## 2019-12-04 DIAGNOSIS — G8929 Other chronic pain: Secondary | ICD-10-CM

## 2019-12-04 NOTE — Patient Instructions (Addendum)
You had a R shoulder injection today. Call or go to the ER if you develop a large red swollen joint with extreme pain or oozing puss.   I think the surgery should be a subacromial decompression and distal clavicle excision. Ask your contact about MRI. Should it be done here or in [place]  If here research locations regionally that offer cash discount.  Try to get as much info about type of surgery offered.  Looking for arthroscopic surgery not open surgery.  I can help rehab here in the [place].

## 2019-12-04 NOTE — Progress Notes (Signed)
I, [name], LAT, ATC, am serving as scribe for Dr. Clementeen Graham.  Joel James is a 59 y.o. male who presents to [facility] at [facility] today for f/u of chronic B shoulder pain, R >L.  He was last seen by Dr. [name] on 10/10/19 and had B subacromial injections.  He has a HEP.  Since his last visit, pt reports con't pain w/ overhead shoulder ROM and w/ functional IR.  He states that his R shoulder has been flared up for approximately 6 days.  He states that his L shoulder is not currently bothering him.  Diagnostic imaging: R shoulder XR- 10/01/12  Pertinent review of systems: No fevers or chills  Relevant historical information: Bilateral shoulder pain, hypertension, hyperlipidemia   Exam:  BP 120/72 (BP Location: Left Arm, Patient Position: Sitting, Cuff Size: Normal)   Pulse 91   Ht 5\' 11"  (1.803 m)   Wt 183 lb 6.4 oz (83.2 kg)   SpO2 98%   BMI 25.58 kg/m  General: Well Developed, well nourished, and in no acute distress.   MSK: Right shoulder normal-appearing normal motion and normal strength.    Lab and Radiology Results  Procedure: Real-time Ultrasound Guided Injection of right shoulder subacromial bursa Device: Philips Affiniti 50G Images permanently stored and available for review in the ultrasound unit. Verbal informed consent obtained.  Discussed risks and benefits of procedure. Warned about infection bleeding damage to structures skin hypopigmentation and fat atrophy among others. Patient expresses understanding and agreement Time-out conducted.   Noted no overlying erythema, induration, or other signs of local infection.   Skin prepped in a sterile fashion.   Local anesthesia: Topical Ethyl chloride.   With sterile technique and under real time ultrasound guidance:  40 mg of Kenalog and 2 mL of Marcaine injected easily.   Completed without difficulty   Pain immediately resolved suggesting accurate placement of the medication.   Advised to  call if fevers/chills, erythema, induration, drainage, or persistent bleeding.   Images permanently stored and available for review in the ultrasound unit.  Impression: Technically successful ultrasound guided injection.         Assessment and Plan: 59 y.o. male with right shoulder pain.  Status post injection again today.  Fundamentally the steroid injections are not working as well as he would like any longer.  At this point he really should have surgery however his barrier is lack of health insurance.  He is done some research and is thinking about having surgery in 66.  Discussed that although not ideal is reasonable.  Advised to give me a little more information about this.  Happy to arrange for MRI here in the states prior to travel to [place] if requested.  Certainly also happy to do some of the rehab here the note states as well.   PDMP not reviewed this encounter. Orders Placed This Encounter  Procedures  . [place] LIMITED JOINT SPACE STRUCTURES UP RIGHT(NO LINKED CHARGES)    Order Specific Question:   Reason for Exam (SYMPTOM  OR DIAGNOSIS REQUIRED)    Answer:   R shoulder pain    Order Specific Question:   Preferred imaging location?    Answer:   [facility]   No orders of the defined types were placed in this encounter.    Discussed warning signs or symptoms. Please see discharge instructions. Patient expresses understanding.   The above documentation has been reviewed and is accurate and complete US,  M.D.

## 2019-12-05 ENCOUNTER — Ambulatory Visit: Payer: Self-pay | Admitting: Family Medicine

## 2019-12-09 ENCOUNTER — Other Ambulatory Visit: Payer: Self-pay

## 2019-12-09 ENCOUNTER — Ambulatory Visit (INDEPENDENT_AMBULATORY_CARE_PROVIDER_SITE_OTHER): Payer: Self-pay | Admitting: Family Medicine

## 2019-12-09 ENCOUNTER — Encounter: Payer: Self-pay | Admitting: Family Medicine

## 2019-12-09 VITALS — BP 128/86 | HR 68 | Temp 98.2°F | Ht 70.87 in | Wt 180.0 lb

## 2019-12-09 DIAGNOSIS — I10 Essential (primary) hypertension: Secondary | ICD-10-CM

## 2019-12-09 DIAGNOSIS — R5383 Other fatigue: Secondary | ICD-10-CM

## 2019-12-09 DIAGNOSIS — M75102 Unspecified rotator cuff tear or rupture of left shoulder, not specified as traumatic: Secondary | ICD-10-CM

## 2019-12-09 DIAGNOSIS — E782 Mixed hyperlipidemia: Secondary | ICD-10-CM

## 2019-12-09 DIAGNOSIS — N529 Male erectile dysfunction, unspecified: Secondary | ICD-10-CM

## 2019-12-09 DIAGNOSIS — G4700 Insomnia, unspecified: Secondary | ICD-10-CM

## 2019-12-09 DIAGNOSIS — M75101 Unspecified rotator cuff tear or rupture of right shoulder, not specified as traumatic: Secondary | ICD-10-CM

## 2019-12-09 MED ORDER — TEMAZEPAM 15 MG PO CAPS: ORAL_CAPSULE | ORAL | 3 refills | Status: DC

## 2019-12-09 MED ORDER — OLMESARTAN MEDOXOMIL 40 MG PO TABS: 40.0000 mg | ORAL_TABLET | Freq: Every day | ORAL | 3 refills | Status: DC

## 2019-12-09 MED ORDER — ROSUVASTATIN CALCIUM 40 MG PO TABS: 40.0000 mg | ORAL_TABLET | Freq: Every day | ORAL | 3 refills | Status: DC

## 2019-12-09 MED ORDER — AMLODIPINE BESYLATE 10 MG PO TABS: 10.0000 mg | ORAL_TABLET | Freq: Every day | ORAL | 3 refills | Status: DC

## 2019-12-09 MED ORDER — OMEPRAZOLE 20 MG PO CPDR: DELAYED_RELEASE_CAPSULE | ORAL | 3 refills | Status: DC

## 2019-12-09 MED ORDER — HYDROCHLOROTHIAZIDE 25 MG PO TABS: 25.0000 mg | ORAL_TABLET | Freq: Every day | ORAL | 3 refills | Status: DC

## 2019-12-09 MED ORDER — QUETIAPINE FUMARATE 25 MG PO TABS: 25.0000 mg | ORAL_TABLET | Freq: Every evening | ORAL | 1 refills | Status: DC | PRN

## 2019-12-09 MED ORDER — SILDENAFIL CITRATE 20 MG PO TABS: 20.0000 mg | ORAL_TABLET | ORAL | 11 refills | Status: DC | PRN

## 2019-12-09 NOTE — Assessment & Plan Note (Signed)
He is considering having surgery completed in [place]  He will continue to follow with Dr. [name] for now.

## 2019-12-09 NOTE — Patient Instructions (Signed)
Great to meet you today! Please continue current medications Let me know if they require you to have a pre-op EKG.  Please have labs completed, we'll be in touch with results.   See me again in 6 months.

## 2019-12-09 NOTE — Progress Notes (Signed)
Joel James - 59 y.o. male MRN [id number]  Date of birth: Jan 10, 1961  Subjective Chief Complaint  Patient presents with  . Establish Care    HPI Joel James is a 59 y.o. male with history of HTN, HLD, insomnia and ED here today for follow up visit.  He reports that he is doing fairly well .  He needs refills of medications.    He has been seeing Dr. Georgina Snell for his shoulder pain.  Considering having surgery completed in [place] due to being uninsured and prohibitively high cost of having this completed in the [place].    BP is managed with benicar, amlodipine and hctz.  He is doing well with current medications.  He denies side effects from medication.  He has had some fatigue but denies chest pain, shortness of breath, dyspnea on exertion, headache or vision changes.   He is tolerating crestor well for management of cholesterol.  He is overdue for lab testing.    Sleeping well with temazepam and seroquel.  He denies side effects from this including over-sedation from medication.    ROS:  A comprehensive ROS was completed and negative except as noted per HPI  Allergies  Allergen Reactions  . Codeine   . Penicillins   . Propoxyphene Hcl     Past Medical History:  Diagnosis Date  . Hyperlipidemia   . Hypertension     Past Surgical History:  Procedure Laterality Date  . APPENDECTOMY      Social History   Socioeconomic History  . Marital status: Divorced    Spouse name: Not on file  . Number of children: Not on file  . Years of education: Not on file  . Highest education level: Not on file  Occupational History  . Not on file  Tobacco Use  . Smoking status: Never Smoker  . Smokeless tobacco: Never Used  Substance and Sexual Activity  . Alcohol use: Yes  . Drug use: No  . Sexual activity: Not on file  Other Topics Concern  . Not on file  Social History Narrative  . Not on file   Social Determinants of Health   Financial Resource Strain:   . Difficulty of Paying  Living Expenses:   Food Insecurity:   . Worried About Charity fundraiser in the Last Year:   . Arboriculturist in the Last Year:   Transportation Needs:   . Film/video editor (Medical):   Marland Kitchen Lack of Transportation (Non-Medical):   Physical Activity:   . Days of Exercise per Week:   . Minutes of Exercise per Session:   Stress:   . Feeling of Stress :   Social Connections:   . Frequency of Communication with Friends and Family:   . Frequency of Social Gatherings with Friends and Family:   . Attends Religious Services:   . Active Member of Clubs or Organizations:   . Attends Archivist Meetings:   Marland Kitchen Marital Status:     Family History  Problem Relation Age of Onset  . Heart failure Father   . Hypertension Father   . Diabetes Father   . Hyperlipidemia Father   . Heart attack Father   . Hyperlipidemia Mother   . Hypertension Mother   . Heart disease Brother     Health Maintenance  Topic Date Due  . Hepatitis C Screening  Never done  . HIV Screening  Never done  . INFLUENZA VACCINE  02/02/2020  . COLONOSCOPY  06/13/2021  . TETANUS/TDAP  02/19/2022     ----------------------------------------------------------------------------------------------------------------------------------------------------------------------------------------------------------------- Physical Exam BP 128/86   Pulse 68   Temp 98.2 F (36.8 C) (Oral)   Ht 5' 10.87" (1.8 m)   Wt 180 lb (81.6 kg)   SpO2 96%   BMI 25.20 kg/m   Physical Exam Constitutional:      Appearance: Normal appearance.  HENT:     Head: Normocephalic and atraumatic.  Eyes:     General: No scleral icterus. Cardiovascular:     Rate and Rhythm: Normal rate and regular rhythm.     Heart sounds: Normal heart sounds.  Pulmonary:     Effort: Pulmonary effort is normal.     Breath sounds: Normal breath sounds.  Musculoskeletal:     Cervical back: Neck supple.  Skin:    General: Skin is warm and dry.   Neurological:     General: No focal deficit present.     Mental Status: He is alert.  Psychiatric:        Mood and Affect: Mood normal.        Behavior: Behavior normal.     ------------------------------------------------------------------------------------------------------------------------------------------------------------------------------------------------------------------- Assessment and Plan  Essential hypertension Blood pressure is at goal at for age and co-morbidities.  I recommend he continue current medications.  In addition they were instructed to follow a low sodium diet with regular exercise to help to maintain adequate control of blood pressure.    Insomnia Well controlled with seroquel and temazepam.  Will continue at current dose.   Rotator cuff syndrome of both shoulders He is considering having surgery completed in [place]  He will continue to follow with Dr. [name] for now.   HLD (hyperlipidemia) Tolerating crestor well.   Continue at current dose and will have him update lipid panel and LFT's.   ED (erectile dysfunction) Sildenafil is effective for him, continue at current dose.    Meds ordered this encounter  Medications  . amLODipine (NORVASC) 10 MG tablet    Sig: Take 1 tablet (10 mg total) by mouth daily.    Dispense:  90 tablet    Refill:  3  . hydrochlorothiazide (HYDRODIURIL) 25 MG tablet    Sig: Take 1 tablet (25 mg total) by mouth daily.    Dispense:  90 tablet    Refill:  3  . olmesartan (BENICAR) 40 MG tablet    Sig: Take 1 tablet (40 mg total) by mouth daily.    Dispense:  90 tablet    Refill:  3    This prescription was filled on 05/08/2019. Any refills authorized will be placed on file.  Marland Kitchen omeprazole (PRILOSEC) 20 MG capsule    Sig: TAKE ONE CAPSULE BY MOUTH 2 TIMES A DAY    Dispense:  180 capsule    Refill:  3  . QUEtiapine (SEROQUEL) 25 MG tablet    Sig: Take 1-4 tablets (25-100 mg total) by mouth at bedtime as needed  (Insomnia).    Dispense:  90 tablet    Refill:  1  . rosuvastatin (CRESTOR) 40 MG tablet    Sig: Take 1 tablet (40 mg total) by mouth daily.    Dispense:  90 tablet    Refill:  3    This prescription was filled on 02/04/2019. Any refills authorized will be placed on file.  . sildenafil (REVATIO) 20 MG tablet    Sig: Take 1 tablet (20 mg total) by mouth as needed.    Dispense:  50 tablet    Refill:  11  . temazepam (RESTORIL) 15 MG capsule    Sig: TAKE 1 TO 2 CAPSULES BY MOUTH AT BEDTIME PRN FOR SLEEP    Dispense:  60 capsule    Refill:  3    Return in about 6 months (around 06/09/2020) for HTN/HLD.    This visit occurred during the SARS-CoV-2 public health emergency.  Safety protocols were in place, including screening questions prior to the visit, additional usage of staff PPE, and extensive cleaning of exam room while observing appropriate contact time as indicated for disinfecting solutions.

## 2019-12-09 NOTE — Assessment & Plan Note (Signed)
Tolerating crestor well.   Continue at current dose and will have him update lipid panel and LFT's.

## 2019-12-09 NOTE — Assessment & Plan Note (Signed)
Well controlled with seroquel and temazepam.  Will continue at current dose.

## 2019-12-09 NOTE — Assessment & Plan Note (Signed)
Blood pressure is at goal at for age and co-morbidities.  I recommend he continue current medications.  In addition they were instructed to follow a low sodium diet with regular exercise to help to maintain adequate control of blood pressure.   

## 2019-12-09 NOTE — Assessment & Plan Note (Signed)
Sildenafil is effective for him, continue at current dose.

## 2019-12-16 ENCOUNTER — Encounter: Payer: Self-pay | Admitting: Family Medicine

## 2019-12-20 ENCOUNTER — Encounter: Payer: Self-pay | Admitting: Family Medicine

## 2019-12-20 DIAGNOSIS — M75102 Unspecified rotator cuff tear or rupture of left shoulder, not specified as traumatic: Secondary | ICD-10-CM

## 2019-12-20 DIAGNOSIS — M25512 Pain in left shoulder: Secondary | ICD-10-CM

## 2019-12-29 ENCOUNTER — Other Ambulatory Visit: Payer: Self-pay | Admitting: Family Medicine

## 2020-01-10 DIAGNOSIS — M67912 Unspecified disorder of synovium and tendon, left shoulder: Secondary | ICD-10-CM | POA: Diagnosis not present

## 2020-01-10 DIAGNOSIS — M67911 Unspecified disorder of synovium and tendon, right shoulder: Secondary | ICD-10-CM | POA: Diagnosis not present

## 2020-01-13 ENCOUNTER — Ambulatory Visit (INDEPENDENT_AMBULATORY_CARE_PROVIDER_SITE_OTHER): Payer: BLUE CROSS/BLUE SHIELD

## 2020-01-13 ENCOUNTER — Ambulatory Visit (HOSPITAL_COMMUNITY)
Admission: EM | Admit: 2020-01-13 | Discharge: 2020-01-13 | Disposition: A | Discharge status: Home / Self Care | Payer: BLUE CROSS/BLUE SHIELD | Attending: Physician Assistant | Admitting: Physician Assistant

## 2020-01-13 ENCOUNTER — Other Ambulatory Visit: Payer: Self-pay

## 2020-01-13 ENCOUNTER — Encounter (HOSPITAL_COMMUNITY): Payer: Self-pay

## 2020-01-13 DIAGNOSIS — S6991XA Unspecified injury of right wrist, hand and finger(s), initial encounter: Secondary | ICD-10-CM | POA: Diagnosis not present

## 2020-01-13 DIAGNOSIS — S63612A Unspecified sprain of right middle finger, initial encounter: Secondary | ICD-10-CM | POA: Diagnosis not present

## 2020-01-13 DIAGNOSIS — M25512 Pain in left shoulder: Secondary | ICD-10-CM | POA: Diagnosis not present

## 2020-01-13 DIAGNOSIS — M79644 Pain in right finger(s): Secondary | ICD-10-CM | POA: Diagnosis not present

## 2020-01-13 DIAGNOSIS — T1490XA Injury, unspecified, initial encounter: Secondary | ICD-10-CM

## 2020-01-13 DIAGNOSIS — M25511 Pain in right shoulder: Secondary | ICD-10-CM | POA: Diagnosis not present

## 2020-01-13 NOTE — ED Provider Notes (Signed)
[facility]    CSN: [id number] Arrival date & time: 01/13/20  1039      History   Chief Complaint Chief Complaint  Patient presents with   Hand Pain    HPI Joel James is a 59 y.o. male.   The history is provided by the patient. No language interpreter was used.  Hand Pain This is a new problem. The current episode started more than 1 week ago. The problem occurs constantly. The problem has not changed since onset.Nothing aggravates the symptoms. Nothing relieves the symptoms. He has tried nothing for the symptoms.  Pt report he was using a drill a week ago and injured his right 3rd finger.  Pt complains of pain in middle joint   Past Medical History:  Diagnosis Date   Hyperlipidemia    Hypertension     Patient Active Problem List   Diagnosis Date Noted   ED (erectile dysfunction) 09/19/2016   Insomnia 06/10/2016   Rotator cuff syndrome of both shoulders 11/04/2008   HLD (hyperlipidemia) 06/13/2008   Essential hypertension 06/13/2008    Past Surgical History:  Procedure Laterality Date   APPENDECTOMY         Home Medications    Prior to Admission medications   Medication Sig Start Date End Date Taking? Authorizing Provider  amLODipine (NORVASC) 10 MG tablet Take 1 tablet (10 mg total) by mouth daily. 12/09/19   Everrett Coombe, DO  aspirin 325 MG tablet Take 325 mg by mouth daily.    [provider]  hydrochlorothiazide (HYDRODIURIL) 25 MG tablet Take 1 tablet (25 mg total) by mouth daily. 12/09/19   Everrett Coombe, DO  olmesartan (BENICAR) 40 MG tablet Take 1 tablet (40 mg total) by mouth daily. 12/09/19   Everrett Coombe, DO  omeprazole (PRILOSEC) 20 MG capsule TAKE ONE CAPSULE BY MOUTH 2 TIMES A DAY 12/09/19   Everrett Coombe, DO  QUEtiapine (SEROQUEL) 25 MG tablet Take 1-4 tablets (25-100 mg total) by mouth at bedtime as needed (Insomnia). 12/09/19   Everrett Coombe, DO  rosuvastatin (CRESTOR) 40 MG tablet Take 1 tablet (40 mg total) by  mouth daily. 12/09/19   Everrett Coombe, DO  sildenafil (REVATIO) 20 MG tablet Take 1 tablet (20 mg total) by mouth as needed. 12/09/19   Everrett Coombe, DO  temazepam (RESTORIL) 15 MG capsule TAKE 1 TO 2 CAPSULES BY MOUTH AT BEDTIME PRN FOR SLEEP 12/09/19   Everrett Coombe, DO    Family History Family History  Problem Relation Age of Onset   Heart failure Father    Hypertension Father    Diabetes Father    Hyperlipidemia Father    Heart attack Father    Hyperlipidemia Mother    Hypertension Mother    Heart disease Brother     Social History Social History   Tobacco Use   Smoking status: Never Smoker   Smokeless tobacco: Never Used  Substance Use Topics   Alcohol use: Yes   Drug use: No     Allergies   Codeine, Penicillins, and Propoxyphene hcl   Review of Systems Review of Systems  Musculoskeletal: Positive for joint swelling and myalgias.  All other systems reviewed and are negative.    Physical Exam Triage Vital Signs ED Triage Vitals  Enc Vitals Group     BP 01/13/20 1109 94/64     Pulse Rate 01/13/20 1109 83     Resp 01/13/20 1109 17     Temp 01/13/20 1109 98.1 F (36.7 C)  Temp Source 01/13/20 1109 Oral     SpO2 01/13/20 1109 99 %     Weight 01/13/20 1108 170 lb (77.1 kg)     Height --      Head Circumference --      Peak Flow --      Pain Score 01/13/20 1107 6     Pain Loc --      Pain Edu? --      Excl. in GC? --    No data found.  Updated Vital Signs BP 94/64 (BP Location: Right Arm)    Pulse 83    Temp 98.1 F (36.7 C) (Oral)    Resp 17    Wt 77.1 kg    SpO2 99%    BMI 23.80 kg/m   Visual Acuity Right Eye Distance:   Left Eye Distance:   Bilateral Distance:    Right Eye Near:   Left Eye Near:    Bilateral Near:     Physical Exam Vitals reviewed.  Cardiovascular:     Rate and Rhythm: Normal rate.  Pulmonary:     Effort: Pulmonary effort is normal.  Musculoskeletal:        General: Swelling, tenderness and signs of  injury present.     Comments: Swollen middle phalanx,  Pain with range of motion  nv and ns intact   Skin:    General: Skin is warm.  Neurological:     General: No focal deficit present.     Mental Status: He is alert.  Psychiatric:        Mood and Affect: Mood normal.      UC Treatments / Results  Labs (all labs ordered are listed, but only abnormal results are displayed) Labs Reviewed - No data to display  EKG   Radiology DG Finger Middle Right  Result Date: 01/13/2020 CLINICAL DATA:  Right middle finger pain after injury last week. EXAM: RIGHT MIDDLE FINGER 2+V COMPARISON:  None. FINDINGS: There is no evidence of fracture or dislocation. There is no evidence of arthropathy or other focal bone abnormality. Soft tissues are unremarkable. IMPRESSION: Negative. Electronically Signed   By: Lupita Raider M.D.   On: 01/13/2020 12:14    Procedures Procedures (including critical care time)  Medications Ordered in UC Medications - No data to display  Initial Impression / Assessment and Plan / UC Course  I have reviewed the triage vital signs and the nursing notes.  Pertinent labs & imaging results that were available during my care of the patient were reviewed by me and considered in my medical decision making (see chart for details).     MDM:  Xray no fracture.  I suspect sprain.  Pt place in a splint and referred to Dr. Orlan Leavens for recheck if pain persist  Final Clinical Impressions(s) / UC Diagnoses   Final diagnoses:  Injury  Sprain of right middle finger, unspecified site of digit, initial encounter   Discharge Instructions   None    ED Prescriptions    None     PDMP not reviewed this encounter.  An After Visit Summary was printed and given to the patient.    Elson Areas, [place] 01/13/20 1243

## 2020-01-13 NOTE — Discharge Instructions (Signed)
Wear splint for the next week.  See Dr. Melvyn Novas for evaluation

## 2020-01-13 NOTE — ED Triage Notes (Signed)
Pt is here with right middle finger pain that started a week ago after using a hand drill. Pt has not taken anything to relieve discomfort.

## 2020-01-17 DIAGNOSIS — M75111 Incomplete rotator cuff tear or rupture of right shoulder, not specified as traumatic: Secondary | ICD-10-CM | POA: Diagnosis not present

## 2020-01-17 DIAGNOSIS — M67912 Unspecified disorder of synovium and tendon, left shoulder: Secondary | ICD-10-CM | POA: Diagnosis not present

## 2020-01-21 DIAGNOSIS — G8918 Other acute postprocedural pain: Secondary | ICD-10-CM | POA: Diagnosis not present

## 2020-01-21 DIAGNOSIS — M75111 Incomplete rotator cuff tear or rupture of right shoulder, not specified as traumatic: Secondary | ICD-10-CM | POA: Diagnosis not present

## 2020-01-21 DIAGNOSIS — M7541 Impingement syndrome of right shoulder: Secondary | ICD-10-CM | POA: Diagnosis not present

## 2020-01-29 DIAGNOSIS — Z9889 Other specified postprocedural states: Secondary | ICD-10-CM | POA: Diagnosis not present

## 2020-02-06 DIAGNOSIS — M25611 Stiffness of right shoulder, not elsewhere classified: Secondary | ICD-10-CM | POA: Diagnosis not present

## 2020-02-06 DIAGNOSIS — M75111 Incomplete rotator cuff tear or rupture of right shoulder, not specified as traumatic: Secondary | ICD-10-CM | POA: Diagnosis not present

## 2020-02-24 ENCOUNTER — Encounter: Payer: Self-pay | Admitting: Family Medicine

## 2020-02-24 DIAGNOSIS — E782 Mixed hyperlipidemia: Secondary | ICD-10-CM | POA: Diagnosis not present

## 2020-02-24 DIAGNOSIS — M75111 Incomplete rotator cuff tear or rupture of right shoulder, not specified as traumatic: Secondary | ICD-10-CM | POA: Diagnosis not present

## 2020-02-24 DIAGNOSIS — R5383 Other fatigue: Secondary | ICD-10-CM | POA: Diagnosis not present

## 2020-02-24 DIAGNOSIS — I10 Essential (primary) hypertension: Secondary | ICD-10-CM | POA: Diagnosis not present

## 2020-02-24 DIAGNOSIS — M25611 Stiffness of right shoulder, not elsewhere classified: Secondary | ICD-10-CM | POA: Diagnosis not present

## 2020-02-25 ENCOUNTER — Other Ambulatory Visit: Payer: Self-pay | Admitting: Family Medicine

## 2020-02-25 DIAGNOSIS — G4700 Insomnia, unspecified: Secondary | ICD-10-CM

## 2020-02-25 DIAGNOSIS — G479 Sleep disorder, unspecified: Secondary | ICD-10-CM

## 2020-02-25 LAB — LIPID PANEL
Cholesterol: 175 mg/dL (ref <200)
HDL: 49 mg/dL (ref >=40)
LDL Cholesterol (Calc): 106 mg/dL (calc) — ABNORMAL HIGH
Non-HDL Cholesterol (Calc): 126 mg/dL (calc) (ref <130)
Total CHOL/HDL Ratio: 3.6 (calc) (ref <5.0)
Triglycerides: 105 mg/dL (ref <150)

## 2020-02-25 LAB — COMPLETE METABOLIC PANEL WITH GFR
AG Ratio: 1.8 (calc) (ref 1.0–2.5)
ALT: 24 U/L (ref 9–46)
AST: 20 U/L (ref 10–35)
Albumin: 4.3 g/dL (ref 3.6–5.1)
Alkaline phosphatase (APISO): 66 U/L (ref 35–144)
BUN/Creatinine Ratio: 24 (calc) — ABNORMAL HIGH (ref 6–22)
BUN: 26 mg/dL — ABNORMAL HIGH (ref 7–25)
CO2: 29 mmol/L (ref 20–32)
Calcium: 9.4 mg/dL (ref 8.6–10.3)
Chloride: 102 mmol/L (ref 98–110)
Creat: 1.07 mg/dL (ref 0.70–1.25)
GFR, Est African American: 87 mL/min/{1.73_m2} (ref >=60)
GFR, Est Non African American: 75 mL/min/{1.73_m2} (ref >=60)
Globulin: 2.4 g/dL (calc) (ref 1.9–3.7)
Glucose, Bld: 90 mg/dL (ref 65–99)
Potassium: 4.1 mmol/L (ref 3.5–5.3)
Sodium: 140 mmol/L (ref 135–146)
Total Bilirubin: 0.3 mg/dL (ref 0.2–1.2)
Total Protein: 6.7 g/dL (ref 6.1–8.1)

## 2020-02-25 LAB — CBC
HCT: 41.1 % (ref 38.5–50.0)
Hemoglobin: 13.7 g/dL (ref 13.2–17.1)
MCH: 30.1 pg (ref 27.0–33.0)
MCHC: 33.3 g/dL (ref 32.0–36.0)
MCV: 90.3 fL (ref 80.0–100.0)
MPV: 10.6 fL (ref 7.5–12.5)
Platelets: 336 10*3/uL (ref 140–400)
RBC: 4.55 10*6/uL (ref 4.20–5.80)
RDW: 12.4 % (ref 11.0–15.0)
WBC: 5 10*3/uL (ref 3.8–10.8)

## 2020-02-25 LAB — TSH: TSH: 2.22 mIU/L (ref 0.40–4.50)

## 2020-02-25 NOTE — Telephone Encounter (Signed)
The subject of this MyChart is Question about CBC so I am not really sure what the questions are about here. Last seen in June.

## 2020-02-27 DIAGNOSIS — M25611 Stiffness of right shoulder, not elsewhere classified: Secondary | ICD-10-CM | POA: Diagnosis not present

## 2020-02-27 DIAGNOSIS — M75111 Incomplete rotator cuff tear or rupture of right shoulder, not specified as traumatic: Secondary | ICD-10-CM | POA: Diagnosis not present

## 2020-03-02 DIAGNOSIS — M75111 Incomplete rotator cuff tear or rupture of right shoulder, not specified as traumatic: Secondary | ICD-10-CM | POA: Diagnosis not present

## 2020-03-02 DIAGNOSIS — M25611 Stiffness of right shoulder, not elsewhere classified: Secondary | ICD-10-CM | POA: Diagnosis not present

## 2020-03-04 ENCOUNTER — Telehealth: Payer: Self-pay | Admitting: Family Medicine

## 2020-03-04 DIAGNOSIS — M75111 Incomplete rotator cuff tear or rupture of right shoulder, not specified as traumatic: Secondary | ICD-10-CM | POA: Diagnosis not present

## 2020-03-04 DIAGNOSIS — M25611 Stiffness of right shoulder, not elsewhere classified: Secondary | ICD-10-CM | POA: Diagnosis not present

## 2020-03-04 NOTE — Telephone Encounter (Signed)
Received fax for PA on Omeprazole sent through cover my meds waiting on determination. - CF

## 2020-03-10 NOTE — Telephone Encounter (Signed)
Received fax from [facility] stating they do not cover this drug. Placing in providers box for review. - CF

## 2020-03-11 DIAGNOSIS — M75111 Incomplete rotator cuff tear or rupture of right shoulder, not specified as traumatic: Secondary | ICD-10-CM | POA: Diagnosis not present

## 2020-03-11 DIAGNOSIS — M25611 Stiffness of right shoulder, not elsewhere classified: Secondary | ICD-10-CM | POA: Diagnosis not present

## 2020-03-17 DIAGNOSIS — M25611 Stiffness of right shoulder, not elsewhere classified: Secondary | ICD-10-CM | POA: Diagnosis not present

## 2020-03-17 DIAGNOSIS — M75111 Incomplete rotator cuff tear or rupture of right shoulder, not specified as traumatic: Secondary | ICD-10-CM | POA: Diagnosis not present

## 2020-03-19 DIAGNOSIS — M75111 Incomplete rotator cuff tear or rupture of right shoulder, not specified as traumatic: Secondary | ICD-10-CM | POA: Diagnosis not present

## 2020-03-19 DIAGNOSIS — M25611 Stiffness of right shoulder, not elsewhere classified: Secondary | ICD-10-CM | POA: Diagnosis not present

## 2020-03-24 DIAGNOSIS — M25611 Stiffness of right shoulder, not elsewhere classified: Secondary | ICD-10-CM | POA: Diagnosis not present

## 2020-03-24 DIAGNOSIS — M75111 Incomplete rotator cuff tear or rupture of right shoulder, not specified as traumatic: Secondary | ICD-10-CM | POA: Diagnosis not present

## 2020-03-26 DIAGNOSIS — M75111 Incomplete rotator cuff tear or rupture of right shoulder, not specified as traumatic: Secondary | ICD-10-CM | POA: Diagnosis not present

## 2020-03-26 DIAGNOSIS — M25611 Stiffness of right shoulder, not elsewhere classified: Secondary | ICD-10-CM | POA: Diagnosis not present

## 2020-03-31 DIAGNOSIS — M75111 Incomplete rotator cuff tear or rupture of right shoulder, not specified as traumatic: Secondary | ICD-10-CM | POA: Diagnosis not present

## 2020-03-31 DIAGNOSIS — M25611 Stiffness of right shoulder, not elsewhere classified: Secondary | ICD-10-CM | POA: Diagnosis not present

## 2020-04-01 ENCOUNTER — Encounter: Payer: Self-pay | Admitting: Neurology

## 2020-04-01 ENCOUNTER — Ambulatory Visit (INDEPENDENT_AMBULATORY_CARE_PROVIDER_SITE_OTHER): Payer: BLUE CROSS/BLUE SHIELD | Admitting: Neurology

## 2020-04-01 VITALS — BP 122/78 | HR 89 | Ht 71.0 in | Wt 182.0 lb

## 2020-04-01 DIAGNOSIS — F5104 Psychophysiologic insomnia: Secondary | ICD-10-CM

## 2020-04-01 DIAGNOSIS — F518 Other sleep disorders not due to a substance or known physiological condition: Secondary | ICD-10-CM | POA: Diagnosis not present

## 2020-04-01 DIAGNOSIS — Q8789 Other specified congenital malformation syndromes, not elsewhere classified: Secondary | ICD-10-CM

## 2020-04-01 MED ORDER — ZALEPLON 10 MG PO CAPS: 10.0000 mg | ORAL_CAPSULE | Freq: Every evening | ORAL | 0 refills | Status: DC | PRN

## 2020-04-01 NOTE — Progress Notes (Signed)
SLEEP MEDICINE CLINIC    Provider:  Melvyn Novasarmen  Ladoris Lythgoe, MD  Primary Care Physician:  Everrett CoombeMatthews, [place], DO [address]  [address] [place] [id number]     Referring Provider: Everrett CoombeMatthews, [place], Do [address]  [address] [place],  [place] [id number]          Chief Complaint according to patient   Patient presents with:    . New Patient (Initial Visit)     10+yrs ago had a SS - apnea, stated woke up 180 times and never entered REM. he has struggled for years with sleep. tried and failed mult medications. Amitriptyline, sonata, lunesta ambien, seroquel, clonazepam with his temazepam.       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 Ashley RoyaltyMatthews, DO, referral on 04/01/2020. The patient has chronic insomnia .   Problem according to patient : Mr. Joel James stated that he had a sleep study over a decade ago and at the time was only told that he was having fragmented sleep with many many arousals lack of REM sleep, but no apnea was found.  He was not sure of any other physiologic abnormalities were described.   And he states that he has struggled with chronic insomnia ever since his college years.  During his college years and  school years,  he actually had no trouble falling asleep,  but as soon as he entered the workforce his sleep pattern changed drastically.  He does not recall any kind of traumatic event , neither physiologically nor psychologically,  that could have set this insomnia pattern off. He does not endorse sleepiness, endorsed the Epworth score at 0/24. He had a sleep study in 2009. He can function on 3 hours of sleep.  5 hours is optimal. After 8 hours he feels more tired.    I have the pleasure of seeing Joel Dredgeeil P Linder today, a right -handed caucasian male with a known chronic insomnia condition- . He   has a past medical history of Hyperlipidemia,  Insomnia, GERD  and Hypertension. 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    Sleep relevant medical history: Nocturia 2 , Sleep walking on sleep aids- , Parasomnia on sleep aid,    Family medical /sleep history: every family member has insomnia.   Social history: Patient is working as a Scientist, forensicVintner, he planted a Radiographer, [place]  and lives in a household with spouse, The patient currently works, Pets are  Present. 3 great danes.  Tobacco use= never .  ETOH use - beer,  Caffeine intake in form of Coffee( a ton of coffee - now one cup a day- acid reflux. ) Soda( /) Tea ( /) or energy drinks. Regular exercise- none .      Sleep habits are as follows: The patient's dinner time is between 2-3 PM. The patient goes to bed at variable - times, 10 PM and continues to sleep for 1-3 hours, wakes for one bathroom break, has shoulder pain.  The preferred sleep position is side aways-, with the support of 2 pillows.  Dreams are reportedly rfrequent/vivid- and this is unusual for a short sleeper.  AM is the usual rise time.  The patient wakes up spontaneously- "never use an alarm" . He reports feeling refreshed or restored  in AM, no naps.     Review of Systems: Out of a complete 14 system review, the patient complains of only the following symptoms, and all other reviewed systems are negative.:  Fatigue, sleepiness , snoring, fragmented sleep, Insomnia- short sleep phase.    How likely are you to doze in the following situations: 0 = not likely, 1 = slight chance, 2 = moderate chance, 3 = high chance   Sitting and Reading? Watching Television? Sitting inactive in a public place (theater or meeting)? As a passenger in a car for an hour without a break? Lying down in the afternoon when circumstances permit? Sitting and talking to someone? Sitting quietly after lunch without alcohol? In a car, while stopped for a few minutes in traffic?    Total = 0/ 24 points   FSS endorsed at n/a / 63 points.   Social History   Socioeconomic History  . Marital status: Divorced    Spouse name: Not on file  . Number of children: Not on file  . Years of education: Not on file  . Highest education level: Not on file  Occupational History  . Not on file  Tobacco Use  . Smoking status: Never Smoker  . Smokeless tobacco: Never Used  Substance and Sexual Activity  . Alcohol use: Yes  . Drug use: No  . Sexual activity: Yes    Birth control/protection: None    Comment: Married  Other Topics Concern  . Not on file  Social History Narrative  . Not on file   Social Determinants of Health   Financial Resource Strain:   . Difficulty of Paying Living Expenses: Not on file  Food Insecurity:   . Worried About Programme researcher, broadcasting/film/video in the Last Year: Not on file  . Ran Out of Food in the Last Year: Not on file  Transportation Needs:   . Lack of Transportation (Medical): Not on file  . Lack of Transportation (Non-Medical): Not on file  Physical Activity:   . Days of Exercise per Week: Not on file  . Minutes of Exercise per Session: Not on file  Stress:   . Feeling of Stress : Not on file  Social Connections:   . Frequency of Communication with Friends and Family: Not on file  . Frequency of Social Gatherings with Friends and Family: Not on file  . Attends Religious Services: Not on file  . Active Member of Clubs or Organizations: Not on file  . Attends Banker Meetings: Not on file  . Marital Status: Not on file    Family History  Problem Relation Age of Onset  . Heart failure Father   . Hypertension Father   . Diabetes Father   . Hyperlipidemia Father   . Heart attack Father   . Hyperlipidemia Mother   . Hypertension Mother   . Heart disease Brother     Past Medical History:  Diagnosis Date  . Hyperlipidemia   . Hypertension     Past Surgical History:  Procedure Laterality Date  . APPENDECTOMY        Current Outpatient Medications on File Prior to Visit  Medication Sig Dispense Refill  . amLODipine (NORVASC) 10 MG tablet Take 1 tablet (10 mg total) by mouth daily. 90 tablet 3  . aspirin 325 MG tablet Take 325 mg by mouth daily.    . celecoxib (CELEBREX) 200 MG capsule Take 200 mg by mouth in the morning and at bedtime.    Marland Kitchen  Cholecalciferol (VITAMIN D-3) 125 MCG (5000 UT) TABS Take 1 tablet by mouth daily.    . hydrochlorothiazide (HYDRODIURIL) 25 MG tablet Take 1 tablet (25 mg total) by mouth daily. 90 tablet 3  . olmesartan (BENICAR) 40 MG tablet Take 1 tablet (40 mg total) by mouth daily. 90 tablet 3  . omeprazole (PRILOSEC) 20 MG capsule TAKE ONE CAPSULE BY MOUTH 2 TIMES A DAY 180 capsule 3  . rosuvastatin (CRESTOR) 40 MG tablet Take 1 tablet (40 mg total) by mouth daily. 90 tablet 3  . sildenafil (REVATIO) 20 MG tablet Take 1 tablet (20 mg total) by mouth as needed. 50 tablet 11  . temazepam (RESTORIL) 15 MG capsule TAKE 1 TO 2 CAPSULES BY MOUTH AT BEDTIME PRN FOR SLEEP 60 capsule 3   No current facility-administered medications on file prior to visit.    Allergies  Allergen Reactions  . Codeine   . Penicillins   . Propoxyphene Hcl     Physical exam:  Today's Vitals   04/01/20 1113  BP: 122/78  Pulse: 89  Weight: 182 lb (82.6 kg)  Height: 5\' 11"  (1.803 m)   Body mass index is 25.38 kg/m.   Wt Readings from Last 3 Encounters:  04/01/20 182 lb (82.6 kg)  01/13/20 170 lb (77.1 kg)  12/09/19 180 lb (81.6 kg)     Ht Readings from Last 3 Encounters:  04/01/20 5\' 11"  (1.803 m)  12/09/19 5' 10.87" (1.8 m)  12/04/19 5\' 11"  (1.803 m)      General: The patient is awake, alert and appears not in acute distress. The patient is well groomed. Head: Normocephalic, atraumatic. Neck is supple. Mallampati 2,  neck circumference:15 inches . Nasal airflow  patent.  Retrognathia is not  seen.  Dental status:  Cardiovascular:  Regular rate and cardiac rhythm by pulse,  without  distended neck veins. Respiratory: Lungs are clear to auscultation.  Skin:  Without evidence of ankle edema, or rash. Trunk: The patient's posture is erect.   Neurologic exam : The patient is awake and alert, oriented to place and time.   Memory subjective described as intact.  Attention span & concentration ability appears normal.  Speech is fluent,  without  dysarthria, dysphonia or aphasia.  Mood and affect are appropriate.   Cranial nerves: no loss of smell or taste reported  Pupils are equal and briskly reactive to light. Funduscopic exam deferred. Status post cataract.   Extraocular movements in vertical and horizontal planes were intact and without nystagmus. No Diplopia. Visual fields by finger perimetry are intact. Hearing was intact to soft voice and finger rubbing.    Facial sensation intact to fine touch.  Facial motor strength is symmetric and tongue and uvula move midline.  Neck ROM : rotation, tilt and flexion extension were normal for age and shoulder shrug was symmetrical.    Motor exam:  Symmetric bulk, tone and ROM.   Normal tone without cog -wheeling, symmetric grip strength .   Sensory:  Fine touch, pinprick and vibration were tested  and  normal.  Proprioception tested in the upper extremities was normal.   Coordination: Rapid alternating movements in the fingers/hands were of normal speed.  The Finger-to-nose maneuver was intact without evidence of ataxia, dysmetria or tremor.   Gait and station: Patient could rise unassisted from a seated position, walked without assistive device.  Stance is of normal width/ base and the patient turned with 3 steps.  Toe and heel walk were deferred.  Deep  tendon reflexes: in the  upper and lower extremities are symmetric and intact.  Babinski response was deferred.      After spending a total time of  45 minutes face to face and additional time for physical and neurologic examination, review of laboratory studies,   personal review of imaging studies, reports and results of other testing and review of referral information / records as far as provided in visit, I have established the following assessments:  1) chronic insomnia without hypersomnia. " I am thinking about work all the time " . He needs to see cognitive behaviour therapist. Denies depression.    2) " I am cold natured" - lets establish some sleep routines, exclude electronic , screen light, keep the bedroom cool, quiet, dark.- Take a hot shower directly before bedtime.    3)  Pain reduction by ibuprofen for shoulder pain.    My Plan is to proceed with:  1) the patient has been in the past with good results with Sonata which helped him to at least initiate sleep, many other sleep medications including temazepam had side effects such as sleepwalking some did not touch him and and others made him feel uncomfortable.  I suspect that the best treatment would be to help with cognitive behavioral therapy and reduce the stressors that in infiltrate his thoughts at night so learning to cut out the voice that tells him about problems he cannot solve at night anyway.  Referral to behavioral cognitive surgery, Dr, Shelva Majestic, PhD.   I also encouraged him to take the COVID vaccine. He is not vaccinated.    I would like to thank Everrett Coombe, DO and Everrett Coombe, Do [address] [place],  [place] [address] for allowing me to meet with and to take care of this pleasant patient.   In short, Joel James is presenting with chronic insomnia.   CC: I will share my notes with PCP.   Electronically signed by: Melvyn Novas, MD 04/01/2020 11:40 AM  [facility] Board certified by The [employer] of Sleep Medicine and Diplomate of the [employer] of Sleep Medicine. Board certified In Neurology through the ABPN, Fellow of the [employer] of Neurology. Medical Director of [employer].

## 2020-04-01 NOTE — Patient Instructions (Signed)

## 2020-04-02 DIAGNOSIS — M25611 Stiffness of right shoulder, not elsewhere classified: Secondary | ICD-10-CM | POA: Diagnosis not present

## 2020-04-02 DIAGNOSIS — M75111 Incomplete rotator cuff tear or rupture of right shoulder, not specified as traumatic: Secondary | ICD-10-CM | POA: Diagnosis not present

## 2020-04-08 DIAGNOSIS — M75111 Incomplete rotator cuff tear or rupture of right shoulder, not specified as traumatic: Secondary | ICD-10-CM | POA: Diagnosis not present

## 2020-04-08 DIAGNOSIS — M25611 Stiffness of right shoulder, not elsewhere classified: Secondary | ICD-10-CM | POA: Diagnosis not present

## 2020-04-09 ENCOUNTER — Telehealth: Payer: Self-pay

## 2020-04-09 NOTE — Telephone Encounter (Signed)
LVM to schedule HST °

## 2020-04-15 DIAGNOSIS — M67911 Unspecified disorder of synovium and tendon, right shoulder: Secondary | ICD-10-CM | POA: Diagnosis not present

## 2020-04-24 DIAGNOSIS — M75111 Incomplete rotator cuff tear or rupture of right shoulder, not specified as traumatic: Secondary | ICD-10-CM | POA: Diagnosis not present

## 2020-04-24 DIAGNOSIS — M25611 Stiffness of right shoulder, not elsewhere classified: Secondary | ICD-10-CM | POA: Diagnosis not present

## 2020-04-28 ENCOUNTER — Telehealth: Payer: Self-pay

## 2020-04-28 NOTE — Telephone Encounter (Signed)
We have attempted to call the patient two times to schedule sleep study.  Patient has been unavailable at the phone numbers we have on file and has not returned our calls. If patient calls back we will schedule them for their sleep study.  

## 2020-04-29 DIAGNOSIS — M75111 Incomplete rotator cuff tear or rupture of right shoulder, not specified as traumatic: Secondary | ICD-10-CM | POA: Diagnosis not present

## 2020-04-29 DIAGNOSIS — M25611 Stiffness of right shoulder, not elsewhere classified: Secondary | ICD-10-CM | POA: Diagnosis not present

## 2020-05-06 DIAGNOSIS — M75112 Incomplete rotator cuff tear or rupture of left shoulder, not specified as traumatic: Secondary | ICD-10-CM | POA: Diagnosis not present

## 2020-05-06 DIAGNOSIS — M7501 Adhesive capsulitis of right shoulder: Secondary | ICD-10-CM | POA: Diagnosis not present

## 2020-05-12 DIAGNOSIS — M75112 Incomplete rotator cuff tear or rupture of left shoulder, not specified as traumatic: Secondary | ICD-10-CM | POA: Diagnosis not present

## 2020-05-12 DIAGNOSIS — M7532 Calcific tendinitis of left shoulder: Secondary | ICD-10-CM | POA: Diagnosis not present

## 2020-05-12 DIAGNOSIS — M7542 Impingement syndrome of left shoulder: Secondary | ICD-10-CM | POA: Diagnosis not present

## 2020-05-12 DIAGNOSIS — G8918 Other acute postprocedural pain: Secondary | ICD-10-CM | POA: Diagnosis not present

## 2020-05-19 ENCOUNTER — Ambulatory Visit (INDEPENDENT_AMBULATORY_CARE_PROVIDER_SITE_OTHER): Payer: BLUE CROSS/BLUE SHIELD | Admitting: Family Medicine

## 2020-05-19 ENCOUNTER — Encounter: Payer: Self-pay | Admitting: Family Medicine

## 2020-05-19 VITALS — BP 111/80 | HR 90 | Wt 178.8 lb

## 2020-05-19 DIAGNOSIS — R599 Enlarged lymph nodes, unspecified: Secondary | ICD-10-CM

## 2020-05-19 DIAGNOSIS — F5104 Psychophysiologic insomnia: Secondary | ICD-10-CM | POA: Diagnosis not present

## 2020-05-19 LAB — CBC WITH DIFFERENTIAL/PLATELET
Absolute Monocytes: 673 cells/uL (ref 200–950)
Basophils Absolute: 41 cells/uL (ref 0–200)
Basophils Relative: 0.7 %
Eosinophils Absolute: 236 cells/uL (ref 15–500)
Eosinophils Relative: 4 %
HCT: 42.8 % (ref 38.5–50.0)
Hemoglobin: 14.7 g/dL (ref 13.2–17.1)
Lymphs Abs: 1457 cells/uL (ref 850–3900)
MCH: 29.3 pg (ref 27.0–33.0)
MCHC: 34.3 g/dL (ref 32.0–36.0)
MCV: 85.3 fL (ref 80.0–100.0)
MPV: 10.1 fL (ref 7.5–12.5)
Monocytes Relative: 11.4 %
Neutro Abs: 3493 cells/uL (ref 1500–7800)
Neutrophils Relative %: 59.2 %
Platelets: 477 10*3/uL — ABNORMAL HIGH (ref 140–400)
RBC: 5.02 10*6/uL (ref 4.20–5.80)
RDW: 12 % (ref 11.0–15.0)
Total Lymphocyte: 24.7 %
WBC: 5.9 10*3/uL (ref 3.8–10.8)

## 2020-05-19 NOTE — Patient Instructions (Addendum)
For sleep study: Try contacting [phone].  I will also send a message for them to reach out to you again.  Have labs completed.  You can stop downstairs to schedule [place] or they will contact you to set this up.

## 2020-05-19 NOTE — Assessment & Plan Note (Signed)
He continues to have sleep difficulties.  Followed by neuro/sleep medicine.  He will call for follow up appt to schedule sleep study.

## 2020-05-19 NOTE — Assessment & Plan Note (Signed)
Area feels like slightly enlarged lymph node.  Will obtain US to for better evaluation.  Check CBC w/ diff.

## 2020-05-19 NOTE — Progress Notes (Signed)
Joel James - 59 y.o. male MRN [id number]  Date of birth: Jun 06, 1961  Subjective No chief complaint on file.   HPI Joel James is a 59 y.o. male here today regarding concern about enlarged lymph node.  He had shoulder surgery last week and was told by anesthesiologist that he had an enlarged lymph node in the L side of his neck and he needed to get this looked at.  He was unaware and had not noticed until she pointed this out.  He has not had any symptoms including pain in this area, fevers, sweats, cough, or GI symptoms.    ROS:  A comprehensive ROS was completed and negative except as noted per HPI  Allergies  Allergen Reactions  . Codeine   . Penicillins   . Propoxyphene Hcl     Past Medical History:  Diagnosis Date  . Hyperlipidemia   . Hypertension     Past Surgical History:  Procedure Laterality Date  . APPENDECTOMY      Social History   Socioeconomic History  . Marital status: Divorced    Spouse name: Not on file  . Number of children: Not on file  . Years of education: Not on file  . Highest education level: Not on file  Occupational History  . Not on file  Tobacco Use  . Smoking status: Never Smoker  . Smokeless tobacco: Never Used  Substance and Sexual Activity  . Alcohol use: Yes  . Drug use: No  . Sexual activity: Yes    Birth control/protection: None    Comment: Married  Other Topics Concern  . Not on file  Social History Narrative  . Not on file   Social Determinants of Health   Financial Resource Strain:   . Difficulty of Paying Living Expenses: Not on file  Food Insecurity:   . Worried About Programme researcher, broadcasting/film/video in the Last Year: Not on file  . Ran Out of Food in the Last Year: Not on file  Transportation Needs:   . Lack of Transportation (Medical): Not on file  . Lack of Transportation (Non-Medical): Not on file  Physical Activity:   . Days of Exercise per Week: Not on file  . Minutes of Exercise per Session: Not on file  Stress:    . Feeling of Stress : Not on file  Social Connections:   . Frequency of Communication with Friends and Family: Not on file  . Frequency of Social Gatherings with Friends and Family: Not on file  . Attends Religious Services: Not on file  . Active Member of Clubs or Organizations: Not on file  . Attends Banker Meetings: Not on file  . Marital Status: Not on file    Family History  Problem Relation Age of Onset  . Heart failure Father   . Hypertension Father   . Diabetes Father   . Hyperlipidemia Father   . Heart attack Father   . Hyperlipidemia Mother   . Hypertension Mother   . Heart disease Brother     Health Maintenance  Topic Date Due  . Hepatitis C Screening  Never done  . COVID-19 Vaccine (1) Never done  . HIV Screening  Never done  . INFLUENZA VACCINE  Never done  . COLONOSCOPY  06/13/2021  . TETANUS/TDAP  02/19/2022     ----------------------------------------------------------------------------------------------------------------------------------------------------------------------------------------------------------------- Physical Exam BP 111/80 (BP Location: Left Arm, Patient Position: Sitting, Cuff Size: Normal)   Pulse 90   Wt 178 lb 12.8 oz (  81.1 kg)   SpO2 98%   BMI 24.94 kg/m   Physical Exam Constitutional:      Appearance: Normal appearance.  HENT:     Head: Normocephalic and atraumatic.  Neck:     Comments: Small, non-tender nodule L lower, anterior neck Cardiovascular:     Rate and Rhythm: Normal rate and regular rhythm.  Pulmonary:     Effort: Pulmonary effort is normal.     Breath sounds: Normal breath sounds.  Musculoskeletal:     Cervical back: Neck supple.  Neurological:     General: No focal deficit present.     Mental Status: He is alert.  Psychiatric:        Mood and Affect: Mood normal.        Behavior: Behavior normal.      ------------------------------------------------------------------------------------------------------------------------------------------------------------------------------------------------------------------- Assessment and Plan  Enlarged lymph node Area feels like slightly enlarged lymph node.  Will obtain US to for better evaluation.  Check CBC w/ diff.   Chronic insomnia He continues to have sleep difficulties.  Followed by neuro/sleep medicine.  He will call for follow up appt to schedule sleep study.    No orders of the defined types were placed in this encounter.   No follow-ups on file.    This visit occurred during the SARS-CoV-2 public health emergency.  Safety protocols were in place, including screening questions prior to the visit, additional usage of staff PPE, and extensive cleaning of exam room while observing appropriate contact time as indicated for disinfecting solutions.

## 2020-05-20 DIAGNOSIS — M25611 Stiffness of right shoulder, not elsewhere classified: Secondary | ICD-10-CM | POA: Diagnosis not present

## 2020-05-20 DIAGNOSIS — M25612 Stiffness of left shoulder, not elsewhere classified: Secondary | ICD-10-CM | POA: Diagnosis not present

## 2020-05-20 DIAGNOSIS — R531 Weakness: Secondary | ICD-10-CM | POA: Diagnosis not present

## 2020-05-20 DIAGNOSIS — Z9889 Other specified postprocedural states: Secondary | ICD-10-CM | POA: Diagnosis not present

## 2020-05-21 DIAGNOSIS — M25612 Stiffness of left shoulder, not elsewhere classified: Secondary | ICD-10-CM | POA: Diagnosis not present

## 2020-05-21 DIAGNOSIS — M25611 Stiffness of right shoulder, not elsewhere classified: Secondary | ICD-10-CM | POA: Diagnosis not present

## 2020-05-21 DIAGNOSIS — R531 Weakness: Secondary | ICD-10-CM | POA: Diagnosis not present

## 2020-05-22 ENCOUNTER — Other Ambulatory Visit: Payer: BLUE CROSS/BLUE SHIELD

## 2020-05-25 DIAGNOSIS — R531 Weakness: Secondary | ICD-10-CM | POA: Diagnosis not present

## 2020-05-25 DIAGNOSIS — M25612 Stiffness of left shoulder, not elsewhere classified: Secondary | ICD-10-CM | POA: Diagnosis not present

## 2020-05-25 DIAGNOSIS — M25611 Stiffness of right shoulder, not elsewhere classified: Secondary | ICD-10-CM | POA: Diagnosis not present

## 2020-05-26 ENCOUNTER — Other Ambulatory Visit: Payer: Self-pay

## 2020-05-26 ENCOUNTER — Ambulatory Visit (INDEPENDENT_AMBULATORY_CARE_PROVIDER_SITE_OTHER): Payer: BLUE CROSS/BLUE SHIELD

## 2020-05-26 DIAGNOSIS — R599 Enlarged lymph nodes, unspecified: Secondary | ICD-10-CM | POA: Diagnosis not present

## 2020-05-27 DIAGNOSIS — R531 Weakness: Secondary | ICD-10-CM | POA: Diagnosis not present

## 2020-05-27 DIAGNOSIS — M25611 Stiffness of right shoulder, not elsewhere classified: Secondary | ICD-10-CM | POA: Diagnosis not present

## 2020-05-27 DIAGNOSIS — M25612 Stiffness of left shoulder, not elsewhere classified: Secondary | ICD-10-CM | POA: Diagnosis not present

## 2020-06-01 DIAGNOSIS — R531 Weakness: Secondary | ICD-10-CM | POA: Diagnosis not present

## 2020-06-01 DIAGNOSIS — M25611 Stiffness of right shoulder, not elsewhere classified: Secondary | ICD-10-CM | POA: Diagnosis not present

## 2020-06-01 DIAGNOSIS — M25612 Stiffness of left shoulder, not elsewhere classified: Secondary | ICD-10-CM | POA: Diagnosis not present

## 2020-06-04 ENCOUNTER — Encounter: Payer: Self-pay | Admitting: Family Medicine

## 2020-06-04 DIAGNOSIS — R531 Weakness: Secondary | ICD-10-CM | POA: Diagnosis not present

## 2020-06-04 DIAGNOSIS — M25612 Stiffness of left shoulder, not elsewhere classified: Secondary | ICD-10-CM | POA: Diagnosis not present

## 2020-06-04 DIAGNOSIS — M25611 Stiffness of right shoulder, not elsewhere classified: Secondary | ICD-10-CM | POA: Diagnosis not present

## 2020-06-05 DIAGNOSIS — M25612 Stiffness of left shoulder, not elsewhere classified: Secondary | ICD-10-CM | POA: Diagnosis not present

## 2020-06-05 DIAGNOSIS — M25611 Stiffness of right shoulder, not elsewhere classified: Secondary | ICD-10-CM | POA: Diagnosis not present

## 2020-06-05 DIAGNOSIS — R531 Weakness: Secondary | ICD-10-CM | POA: Diagnosis not present

## 2020-06-08 ENCOUNTER — Ambulatory Visit (INDEPENDENT_AMBULATORY_CARE_PROVIDER_SITE_OTHER): Payer: BLUE CROSS/BLUE SHIELD | Admitting: Family Medicine

## 2020-06-08 ENCOUNTER — Other Ambulatory Visit: Payer: Self-pay | Admitting: Family Medicine

## 2020-06-08 ENCOUNTER — Encounter: Payer: Self-pay | Admitting: Family Medicine

## 2020-06-08 VITALS — BP 134/82 | HR 91 | Wt 183.4 lb

## 2020-06-08 DIAGNOSIS — E782 Mixed hyperlipidemia: Secondary | ICD-10-CM

## 2020-06-08 DIAGNOSIS — R5383 Other fatigue: Secondary | ICD-10-CM

## 2020-06-08 DIAGNOSIS — M791 Myalgia, unspecified site: Secondary | ICD-10-CM

## 2020-06-08 DIAGNOSIS — M2550 Pain in unspecified joint: Secondary | ICD-10-CM

## 2020-06-08 DIAGNOSIS — Z8249 Family history of ischemic heart disease and other diseases of the circulatory system: Secondary | ICD-10-CM | POA: Diagnosis not present

## 2020-06-08 MED ORDER — BELSOMRA 10 MG PO TABS: 10.0000 mg | ORAL_TABLET | Freq: Every day | ORAL | 1 refills | Status: DC

## 2020-06-08 NOTE — Patient Instructions (Addendum)
Have labs completed.  We'll be in touch with lab results.  I have sent over Belsomra to replace temazepam.  We may need to have insurance authorization on this.

## 2020-06-08 NOTE — Progress Notes (Signed)
Joel James - 59 y.o. male MRN [id number]  Date of birth: 10-28-1960  Subjective Chief Complaint  Patient presents with  . Generalized Body Aches    HPI Joel James is a 59 y.o. male here today with complaint of generalized fatigue and aches.  He is concerned about possible arterial disease as his father died in his early 63s related to coronary artery disease and CHF.  Notice have a history of hypertension as well as hyperlipidemia however these have remained well controlled.  He is a non-smoker.  He has not had any anginal symptoms.  He is frustrated by his continued shoulder pain and difficulty with range of motion however he is having some myalgias all throughout.  He denies any symptoms of numbness or tingling or significant weakness in the extremities.  He does also continue to have difficulties with sleep.  He has tried several medications including Ambien, Ambien CR, Sonata, Restoril and Lunesta.  He has never tried [employer].  He has seen a sleep specialist before and and told that he is just a short sleeper.  He states he does not feel rested in the morning with fatigue throughout the day.  ROS:  A comprehensive ROS was completed and negative except as noted per HPI  Allergies  Allergen Reactions  . Codeine   . Penicillins   . Propoxyphene Hcl     Past Medical History:  Diagnosis Date  . Hyperlipidemia   . Hypertension     Past Surgical History:  Procedure Laterality Date  . APPENDECTOMY      Social History   Socioeconomic History  . Marital status: Divorced    Spouse name: Not on file  . Number of children: Not on file  . Years of education: Not on file  . Highest education level: Not on file  Occupational History  . Not on file  Tobacco Use  . Smoking status: Never Smoker  . Smokeless tobacco: Never Used  Substance and Sexual Activity  . Alcohol use: Yes  . Drug use: No  . Sexual activity: Yes    Birth control/protection: None    Comment: Married   Other Topics Concern  . Not on file  Social History Narrative  . Not on file   Social Determinants of Health   Financial Resource Strain:   . Difficulty of Paying Living Expenses: Not on file  Food Insecurity:   . Worried About Charity fundraiser in the Last Year: Not on file  . Ran Out of Food in the Last Year: Not on file  Transportation Needs:   . Lack of Transportation (Medical): Not on file  . Lack of Transportation (Non-Medical): Not on file  Physical Activity:   . Days of Exercise per Week: Not on file  . Minutes of Exercise per Session: Not on file  Stress:   . Feeling of Stress : Not on file  Social Connections:   . Frequency of Communication with Friends and Family: Not on file  . Frequency of Social Gatherings with Friends and Family: Not on file  . Attends Religious Services: Not on file  . Active Member of Clubs or Organizations: Not on file  . Attends Archivist Meetings: Not on file  . Marital Status: Not on file    Family History  Problem Relation Age of Onset  . Heart failure Father   . Hypertension Father   . Diabetes Father   . Hyperlipidemia Father   . Heart  attack Father   . Hyperlipidemia Mother   . Hypertension Mother   . Heart disease Brother     Health Maintenance  Topic Date Due  . Hepatitis C Screening  Never done  . HIV Screening  Never done  . INFLUENZA VACCINE  Never done  . COVID-19 Vaccine (1) 06/24/2021 (Originally 09/25/1972)  . COLONOSCOPY  06/13/2021  . TETANUS/TDAP  02/19/2022     ----------------------------------------------------------------------------------------------------------------------------------------------------------------------------------------------------------------- Physical Exam BP 134/82 (BP Location: Left Arm, Patient Position: Sitting, Cuff Size: Normal)   Pulse 91   Wt 183 lb 6.4 oz (83.2 kg)   SpO2 97%   BMI 25.58 kg/m   Physical Exam Constitutional:      Appearance: Normal  appearance.  HENT:     Head: Normocephalic and atraumatic.  Eyes:     General: No scleral icterus. Cardiovascular:     Rate and Rhythm: Normal rate and regular rhythm.  Pulmonary:     Effort: Pulmonary effort is normal.     Breath sounds: Normal breath sounds.  Musculoskeletal:     Cervical back: Neck supple.     Comments: He has limited range of motion bilateral shoulders.  He does not have any significant muscle tenderness throughout or weakness in lower extremities.  Pulses in bilateral lower extremities are 2+ dorsalis pedis as well as posterior tibial  Skin:    General: Skin is warm and dry.     Findings: No rash.  Neurological:     General: No focal deficit present.     Mental Status: He is alert and oriented to person, place, and time.     Motor: No weakness.  Psychiatric:        Mood and Affect: Mood normal.        Behavior: Behavior normal.     ------------------------------------------------------------------------------------------------------------------------------------------------------------------------------------------------------------------- Assessment and Plan  Family history of coronary artery disease in father The 10-year ASCVD risk score Mikey Bussing DC Jr., et al., 2013) is: 9.8% We will check high-sensitivity CRP for additional risk stratification.  Will consider referral to cardiology for consideration of coronary CT.       Myalgia Checking CK as well as B12, as well as ESR and CRP.    Other fatigue This is likely related to his poor sleep however given his myalgias as well as fatigue well check testosterone levels.  We will try Belsomra for management of his insomnia as he has tried multiple other medications previously.   Meds ordered this encounter  Medications  . Suvorexant (BELSOMRA) 10 MG TABS    Sig: Take 10 mg by mouth daily.    Dispense:  30 tablet    Refill:  1    No follow-ups on file.    This visit occurred during the SARS-CoV-2  public health emergency.  Safety protocols were in place, including screening questions prior to the visit, additional usage of staff PPE, and extensive cleaning of exam room while observing appropriate contact time as indicated for disinfecting solutions.

## 2020-06-09 DIAGNOSIS — M791 Myalgia, unspecified site: Secondary | ICD-10-CM | POA: Insufficient documentation

## 2020-06-09 DIAGNOSIS — Z8249 Family history of ischemic heart disease and other diseases of the circulatory system: Secondary | ICD-10-CM | POA: Insufficient documentation

## 2020-06-09 DIAGNOSIS — R5383 Other fatigue: Secondary | ICD-10-CM | POA: Insufficient documentation

## 2020-06-09 LAB — C-REACTIVE PROTEIN: CRP: 9 mg/L (ref 0–10)

## 2020-06-09 LAB — CK: Total CK: 47 U/L (ref 41–331)

## 2020-06-09 LAB — VITAMIN B12: Vitamin B-12: 323 pg/mL (ref 232–1245)

## 2020-06-09 LAB — VITAMIN D 25 HYDROXY (VIT D DEFICIENCY, FRACTURES): Vit D, 25-Hydroxy: 66.9 ng/mL (ref 30.0–100.0)

## 2020-06-09 LAB — SEDIMENTATION RATE: Sed Rate: 2 mm/hr (ref 0–30)

## 2020-06-09 LAB — TESTOSTERONE: Testosterone: 718 ng/dL (ref 264–916)

## 2020-06-09 NOTE — Assessment & Plan Note (Signed)
This is likely related to his poor sleep however given his myalgias as well as fatigue well check testosterone levels.  We will try Belsomra for management of his insomnia as he has tried multiple other medications previously.

## 2020-06-09 NOTE — Assessment & Plan Note (Signed)
Checking CK as well as B12, as well as ESR and CRP.   

## 2020-06-09 NOTE — Assessment & Plan Note (Signed)
The 10-year ASCVD risk score Denman George DC Jr., et al., 2013) is: 9.8% We will check high-sensitivity CRP for additional risk stratification.  Will consider referral to cardiology for consideration of coronary CT.

## 2020-06-11 ENCOUNTER — Encounter: Payer: Self-pay | Admitting: Family Medicine

## 2020-06-11 DIAGNOSIS — M25611 Stiffness of right shoulder, not elsewhere classified: Secondary | ICD-10-CM | POA: Diagnosis not present

## 2020-06-11 DIAGNOSIS — M25612 Stiffness of left shoulder, not elsewhere classified: Secondary | ICD-10-CM | POA: Diagnosis not present

## 2020-06-11 DIAGNOSIS — R531 Weakness: Secondary | ICD-10-CM | POA: Diagnosis not present

## 2020-06-18 DIAGNOSIS — R531 Weakness: Secondary | ICD-10-CM | POA: Diagnosis not present

## 2020-06-18 DIAGNOSIS — M25612 Stiffness of left shoulder, not elsewhere classified: Secondary | ICD-10-CM | POA: Diagnosis not present

## 2020-06-18 DIAGNOSIS — M25611 Stiffness of right shoulder, not elsewhere classified: Secondary | ICD-10-CM | POA: Diagnosis not present

## 2020-06-21 ENCOUNTER — Other Ambulatory Visit: Payer: Self-pay | Admitting: Family Medicine

## 2020-07-14 ENCOUNTER — Encounter: Payer: Self-pay | Admitting: Family Medicine

## 2020-07-16 ENCOUNTER — Encounter: Payer: Self-pay | Admitting: Family Medicine

## 2020-07-17 ENCOUNTER — Other Ambulatory Visit: Payer: Self-pay | Admitting: Family Medicine

## 2020-07-17 DIAGNOSIS — R972 Elevated prostate specific antigen [PSA]: Secondary | ICD-10-CM

## 2020-08-20 DIAGNOSIS — R972 Elevated prostate specific antigen [PSA]: Secondary | ICD-10-CM | POA: Insufficient documentation

## 2020-11-11 ENCOUNTER — Other Ambulatory Visit: Payer: Self-pay | Admitting: Family Medicine

## 2020-12-06 ENCOUNTER — Other Ambulatory Visit: Payer: Self-pay | Admitting: Family Medicine

## 2020-12-07 ENCOUNTER — Telehealth: Payer: Self-pay

## 2020-12-07 NOTE — Telephone Encounter (Signed)
Hold refills until appt has been made.

## 2020-12-07 NOTE — Telephone Encounter (Signed)
Please call and schedule follow-up appointment with labs for medication refills.

## 2020-12-08 NOTE — Telephone Encounter (Signed)
Please call and schedule patient a 6 month follow-up appointment. Thanks

## 2020-12-08 NOTE — Telephone Encounter (Signed)
Called pt and voicemail continues to come on and I LVM to call and schedule an appt and labs for medication refills. tvt

## 2020-12-17 ENCOUNTER — Encounter: Payer: Self-pay | Admitting: Family Medicine

## 2020-12-17 ENCOUNTER — Ambulatory Visit (INDEPENDENT_AMBULATORY_CARE_PROVIDER_SITE_OTHER): Payer: BLUE CROSS/BLUE SHIELD | Admitting: Family Medicine

## 2020-12-17 ENCOUNTER — Other Ambulatory Visit: Payer: Self-pay

## 2020-12-17 VITALS — BP 130/79 | HR 63 | Temp 97.8°F | Ht 71.0 in | Wt 179.0 lb

## 2020-12-17 DIAGNOSIS — Z0000 Encounter for general adult medical examination without abnormal findings: Secondary | ICD-10-CM | POA: Diagnosis not present

## 2020-12-17 DIAGNOSIS — E782 Mixed hyperlipidemia: Secondary | ICD-10-CM | POA: Diagnosis not present

## 2020-12-17 DIAGNOSIS — R972 Elevated prostate specific antigen [PSA]: Secondary | ICD-10-CM

## 2020-12-17 DIAGNOSIS — Z125 Encounter for screening for malignant neoplasm of prostate: Secondary | ICD-10-CM

## 2020-12-17 DIAGNOSIS — M545 Low back pain, unspecified: Secondary | ICD-10-CM

## 2020-12-17 DIAGNOSIS — F5104 Psychophysiologic insomnia: Secondary | ICD-10-CM

## 2020-12-17 DIAGNOSIS — N529 Male erectile dysfunction, unspecified: Secondary | ICD-10-CM

## 2020-12-17 DIAGNOSIS — I10 Essential (primary) hypertension: Secondary | ICD-10-CM

## 2020-12-17 LAB — COMPLETE METABOLIC PANEL WITH GFR
AG Ratio: 1.6 (calc) (ref 1.0–2.5)
ALT: 22 U/L (ref 9–46)
AST: 19 U/L (ref 10–35)
Albumin: 4.3 g/dL (ref 3.6–5.1)
Alkaline phosphatase (APISO): 84 U/L (ref 35–144)
BUN: 14 mg/dL (ref 7–25)
CO2: 30 mmol/L (ref 20–32)
Calcium: 9.8 mg/dL (ref 8.6–10.3)
Chloride: 103 mmol/L (ref 98–110)
Creat: 1 mg/dL (ref 0.70–1.25)
GFR, Est African American: 94 mL/min/{1.73_m2} (ref >=60)
GFR, Est Non African American: 81 mL/min/{1.73_m2} (ref >=60)
Globulin: 2.7 g/dL (calc) (ref 1.9–3.7)
Glucose, Bld: 76 mg/dL (ref 65–99)
Potassium: 4.6 mmol/L (ref 3.5–5.3)
Sodium: 140 mmol/L (ref 135–146)
Total Bilirubin: 0.3 mg/dL (ref 0.2–1.2)
Total Protein: 7 g/dL (ref 6.1–8.1)

## 2020-12-17 LAB — LIPID PANEL W/REFLEX DIRECT LDL
Cholesterol: 162 mg/dL (ref <200)
HDL: 56 mg/dL (ref >=40)
LDL Cholesterol (Calc): 92 mg/dL (calc)
Non-HDL Cholesterol (Calc): 106 mg/dL (calc) (ref <130)
Total CHOL/HDL Ratio: 2.9 (calc) (ref <5.0)
Triglycerides: 54 mg/dL (ref <150)

## 2020-12-17 LAB — PSA: PSA: 7.2 ng/mL — ABNORMAL HIGH (ref <=4.00)

## 2020-12-17 LAB — CBC
HCT: 40.7 % (ref 38.5–50.0)
Hemoglobin: 13.6 g/dL (ref 13.2–17.1)
MCH: 29.1 pg (ref 27.0–33.0)
MCHC: 33.4 g/dL (ref 32.0–36.0)
MCV: 87 fL (ref 80.0–100.0)
MPV: 10.7 fL (ref 7.5–12.5)
Platelets: 311 10*3/uL (ref 140–400)
RBC: 4.68 10*6/uL (ref 4.20–5.80)
RDW: 12.8 % (ref 11.0–15.0)
WBC: 4.1 10*3/uL (ref 3.8–10.8)

## 2020-12-17 MED ORDER — AMLODIPINE BESYLATE 10 MG PO TABS: 10.0000 mg | ORAL_TABLET | Freq: Every day | ORAL | 3 refills | Status: DC

## 2020-12-17 MED ORDER — PREDNISONE 10 MG (21) PO TBPK
ORAL_TABLET | ORAL | 0 refills | Status: DC
Start: 2020-12-17 — End: 2021-07-08

## 2020-12-17 MED ORDER — HYDROCHLOROTHIAZIDE 25 MG PO TABS: 25.0000 mg | ORAL_TABLET | Freq: Every day | ORAL | 3 refills | Status: DC

## 2020-12-17 MED ORDER — ROSUVASTATIN CALCIUM 40 MG PO TABS: 40.0000 mg | ORAL_TABLET | Freq: Every day | ORAL | 3 refills | Status: DC

## 2020-12-17 MED ORDER — OLMESARTAN MEDOXOMIL 40 MG PO TABS: 40.0000 mg | ORAL_TABLET | Freq: Every day | ORAL | 3 refills | Status: DC

## 2020-12-17 NOTE — Patient Instructions (Signed)
Acute Back Pain, Adult Acute back pain is sudden and usually short-lived. It is often caused by an injury to the muscles and tissues in the back. The injury may result from: A muscle or ligament getting overstretched or torn (strained). Ligaments are tissues that connect bones to each other. Lifting something improperly can cause a back strain. Wear and tear (degeneration) of the spinal disks. Spinal disks are circular tissue that provide cushioning between the bones of the spine (vertebrae). Twisting motions, such as while playing sports or doing yard work. A hit to the back. Arthritis. You may have a physical exam, lab tests, and imaging tests to find the cause ofyour pain. Acute back pain usually goes away with rest and home care. Follow these instructions at home: Managing pain, stiffness, and swelling Treatment may include medicines for pain and inflammation that are taken by mouth or applied to the skin, prescription pain medicine, or muscle relaxants. Take over-the-counter and prescription medicines only as told by your health care provider. Your health care provider may recommend applying ice during the first 24-48 hours after your pain starts. To do this: Put ice in a plastic bag. Place a towel between your skin and the bag. Leave the ice on for 20 minutes, 2-3 times a day. If directed, apply heat to the affected area as often as told by your health care provider. Use the heat source that your health care provider recommends, such as a moist heat pack or a heating pad. Place a towel between your skin and the heat source. Leave the heat on for 20-30 minutes. Remove the heat if your skin turns bright red. This is especially important if you are unable to feel pain, heat, or cold. You have a greater risk of getting burned. Activity  Do not stay in bed. Staying in bed for more than 1-2 days can delay your recovery. Sit up and stand up straight. Avoid leaning forward when you sit or  hunching over when you stand. If you work at a desk, sit close to it so you do not need to lean over. Keep your chin tucked in. Keep your neck drawn back, and keep your elbows bent at a 90-degree angle (right angle). Sit high and close to the steering wheel when you drive. Add lower back (lumbar) support to your car seat, if needed. Take short walks on even surfaces as soon as you are able. Try to increase the length of time you walk each day. Do not sit, drive, or stand in one place for more than 30 minutes at a time. Sitting or standing for long periods of time can put stress on your back. Do not drive or use heavy machinery while taking prescription pain medicine. Use proper lifting techniques. When you bend and lift, use positions that put less stress on your back: Bend your knees. Keep the load close to your body. Avoid twisting. Exercise regularly as told by your health care provider. Exercising helps your back heal faster and helps prevent back injuries by keeping muscles strong and flexible. Work with a physical therapist to make a safe exercise program, as recommended by your health care provider. Do any exercises as told by your physical therapist.  Lifestyle Maintain a healthy weight. Extra weight puts stress on your back and makes it difficult to have good posture. Avoid activities or situations that make you feel anxious or stressed. Stress and anxiety increase muscle tension and can make back pain worse. Learn ways to manage   anxiety and stress, such as through exercise. General instructions Sleep on a firm mattress in a comfortable position. Try lying on your side with your knees slightly bent. If you lie on your back, put a pillow under your knees. Follow your treatment plan as told by your health care provider. This may include: Cognitive or behavioral therapy. Acupuncture or massage therapy. Meditation or yoga. Contact a health care provider if: You have pain that is not  relieved with rest or medicine. You have increasing pain going down into your legs or buttocks. Your pain does not improve after 2 weeks. You have pain at night. You lose weight without trying. You have a fever or chills. Get help right away if: You develop new bowel or bladder control problems. You have unusual weakness or numbness in your arms or legs. You develop nausea or vomiting. You develop abdominal pain. You feel faint. Summary Acute back pain is sudden and usually short-lived. Use proper lifting techniques. When you bend and lift, use positions that put less stress on your back. Take over-the-counter and prescription medicines and apply heat or ice as directed by your health care provider. This information is not intended to replace advice given to you by your health care provider. Make sure you discuss any questions you have with your healthcare provider. Document Revised: 03/10/2020 Document Reviewed: 03/13/2020 Elsevier Patient Education  2022 [employer] Inc[employer]  

## 2020-12-18 LAB — TESTOSTERONE: Testosterone: 779 ng/dL (ref 250–827)

## 2020-12-20 ENCOUNTER — Encounter: Payer: Self-pay | Admitting: Family Medicine

## 2020-12-20 DIAGNOSIS — M545 Low back pain, unspecified: Secondary | ICD-10-CM | POA: Insufficient documentation

## 2020-12-20 NOTE — Assessment & Plan Note (Signed)
Blood pressure is at goal at for age and co-morbidities.  I recommend continuation of current medications.  In addition they were instructed to follow a low sodium diet with regular exercise to help to maintain adequate control of blood pressure.  ? ?

## 2020-12-20 NOTE — Assessment & Plan Note (Signed)
Temazepam as needed is working well.  Will continue.

## 2020-12-20 NOTE — Assessment & Plan Note (Signed)
Start prednisone taper.  If not improving with this he will let me know.

## 2020-12-20 NOTE — Assessment & Plan Note (Signed)
Update PSA levels. °

## 2020-12-20 NOTE — Assessment & Plan Note (Signed)
Tolerating crestor well.  Update lipid panel.  Prescription renewed.

## 2020-12-20 NOTE — Progress Notes (Signed)
Joel James - 60 y.o. male MRN [id number]  Date of birth: 1961-01-27  Subjective Chief Complaint  Patient presents with   Hypertension   Labs Only    HPI Joel James is a 59 y.o. male here today for follow up.   He would like to have updated labs today.  He has history of HTN, HLD, insomnia and elevated PSA.  Doing well with current medications.  Monitoring BP at home with normal readings.  He denies chest pain, shortness of breath, .palp, headache or vision changes.    He is having some low back pain. He has this off and on.  Steroids are usually helpful for this.   Allergies  Allergen Reactions   Codeine    Penicillins    Propoxyphene Hcl     Past Medical History:  Diagnosis Date   Hyperlipidemia    Hypertension     Past Surgical History:  Procedure Laterality Date   APPENDECTOMY      Social History   Socioeconomic History   Marital status: Married    Spouse name: Not on file   Number of children: Not on file   Years of education: Not on file   Highest education level: Not on file  Occupational History   Not on file  Tobacco Use   Smoking status: Never   Smokeless tobacco: Never  Substance and Sexual Activity   Alcohol use: Yes   Drug use: No   Sexual activity: Yes    Birth control/protection: None    Comment: Married  Other Topics Concern   Not on file  Social History Narrative   Not on file   Social Determinants of Health   Financial Resource Strain: Not on file  Food Insecurity: Not on file  Transportation Needs: Not on file  Physical Activity: Not on file  Stress: Not on file  Social Connections: Not on file    Family History  Problem Relation Age of Onset   Heart failure Father    Hypertension Father    Diabetes Father    Hyperlipidemia Father    Heart attack Father    Hyperlipidemia Mother    Hypertension Mother    Heart disease Brother     Health Maintenance  Topic Date Due   HIV Screening  Never done   Hepatitis C Screening   Never done   Zoster Vaccines- Shingrix (1 of 2) Never done   COVID-19 Vaccine (1) 06/24/2021 (Originally 09/24/1965)   INFLUENZA VACCINE  02/01/2021   COLONOSCOPY (Pts 45-26yrs Insurance coverage will need to be confirmed)  06/13/2021   TETANUS/TDAP  02/19/2022   Pneumococcal Vaccine 45-77 Years old  Aged Out   HPV VACCINES  Aged Out     ----------------------------------------------------------------------------------------------------------------------------------------------------------------------------------------------------------------- Physical Exam BP 130/79 (BP Location: Left Arm, Patient Position: Sitting, Cuff Size: Normal)   Pulse 63   Temp 97.8 F (36.6 C) (Oral)   Ht 5\' 11"  (1.803 m)   Wt 179 lb (81.2 kg)   SpO2 99%   BMI 24.97 kg/m   Physical Exam  ------------------------------------------------------------------------------------------------------------------------------------------------------------------------------------------------------------------- Assessment and Plan  Elevated PSA Update PSA levels.   HLD (hyperlipidemia) Tolerating crestor well.  Update lipid panel.  Prescription renewed.    Chronic insomnia Temazepam as needed is working well.  Will continue.    Essential hypertension Blood pressure is at goal at for age and co-morbidities.  I recommend continuation of current medications.  In addition they were instructed to follow a low sodium diet with regular exercise  to help to maintain adequate control of blood pressure.    Meds ordered this encounter  Medications   amLODipine (NORVASC) 10 MG tablet    Sig: Take 1 tablet (10 mg total) by mouth daily.    Dispense:  90 tablet    Refill:  3    This prescription was filled on 09/17/2020. Any refills authorized will be placed on file.   hydrochlorothiazide (HYDRODIURIL) 25 MG tablet    Sig: Take 1 tablet (25 mg total) by mouth daily.    Dispense:  90 tablet    Refill:  3    This  prescription was filled on 09/17/2020. Any refills authorized will be placed on file.   olmesartan (BENICAR) 40 MG tablet    Sig: Take 1 tablet (40 mg total) by mouth daily.    Dispense:  90 tablet    Refill:  3    This prescription was filled on 09/17/2020. Any refills authorized will be placed on file.   rosuvastatin (CRESTOR) 40 MG tablet    Sig: Take 1 tablet (40 mg total) by mouth daily.    Dispense:  90 tablet    Refill:  3    This prescription was filled on 09/17/2020. Any refills authorized will be placed on file.   predniSONE (STERAPRED UNI-PAK 21 TAB) 10 MG (21) TBPK tablet    Sig: Taper as directed on packaging    Dispense:  21 tablet    Refill:  0    No follow-ups on file.    This visit occurred during the SARS-CoV-2 public health emergency.  Safety protocols were in place, including screening questions prior to the visit, additional usage of staff PPE, and extensive cleaning of exam room while observing appropriate contact time as indicated for disinfecting solutions.

## 2021-04-12 ENCOUNTER — Other Ambulatory Visit: Payer: Self-pay | Admitting: Family Medicine

## 2021-05-11 ENCOUNTER — Other Ambulatory Visit: Payer: Self-pay | Admitting: Family Medicine

## 2021-07-08 ENCOUNTER — Telehealth (INDEPENDENT_AMBULATORY_CARE_PROVIDER_SITE_OTHER): Payer: BLUE CROSS/BLUE SHIELD | Admitting: Family Medicine

## 2021-07-08 ENCOUNTER — Other Ambulatory Visit: Payer: Self-pay

## 2021-07-08 ENCOUNTER — Encounter: Payer: Self-pay | Admitting: Family Medicine

## 2021-07-08 VITALS — Wt 169.0 lb

## 2021-07-08 DIAGNOSIS — G8929 Other chronic pain: Secondary | ICD-10-CM | POA: Diagnosis not present

## 2021-07-08 DIAGNOSIS — M25511 Pain in right shoulder: Secondary | ICD-10-CM | POA: Diagnosis not present

## 2021-07-08 DIAGNOSIS — R112 Nausea with vomiting, unspecified: Secondary | ICD-10-CM

## 2021-07-08 DIAGNOSIS — F5104 Psychophysiologic insomnia: Secondary | ICD-10-CM

## 2021-07-08 DIAGNOSIS — M25512 Pain in left shoulder: Secondary | ICD-10-CM

## 2021-07-08 MED ORDER — ONDANSETRON 4 MG PO TBDP: 8.0000 mg | ORAL_TABLET | Freq: Three times a day (TID) | ORAL | 0 refills | Status: DC | PRN

## 2021-07-08 MED ORDER — DAYVIGO 5 MG PO TABS: 5.0000 mg | ORAL_TABLET | Freq: Every evening | ORAL | 0 refills | Status: DC | PRN

## 2021-07-08 MED ORDER — PREDNISONE 10 MG (21) PO TBPK: ORAL_TABLET | ORAL | 0 refills | Status: DC

## 2021-07-08 NOTE — Progress Notes (Signed)
Virtual Video Visit via MyChart Note converted to telephone   I connected with  Joel James on 07/08/21 at  2:20 PM EST by the video enabled telemedicine application for MyChart, and verified that I am speaking with the correct person using two identifiers.   I introduced myself as a Publishing rights manager with the practice. We discussed the limitations of evaluation and management by telemedicine and the availability of in person appointments. The patient expressed understanding and agreed to proceed.  Participating parties in this visit include: The patient and the nurse practitioner listed.  The patient is: At home I am: In the office - [facility]  Subjective:    CC: insomnia, nausea   HPI: Joel James is a 61 y.o. year old male presenting today via MyChart today for insomnia, shoulder pain, nausea, vomiting.  Patient reports that he has been experiencing some nausea, vomiting, body aches for the past 4 days or so. Reports he had a negative COVID test x2 at home. No known sick contacts. States he thinks he is getting dehydrated because he can't keep anything down and is starting to have some low back pain bilaterally. He denies any urinary changes. States he sometimes gets like this when he doesn't get enough sleep. He has been trying 30 mg of temazepam which has worked well in the past, but he has been on it for so long he isn't finding it helpful anymore. He does not wish to see a sleep specialist as they have not been helpful in the past. He is wondering if there are any new drugs on the market he could try. States he has only had a cumulative 5 hours of sleep this week so far.   Additionally he reports that with the cooler weather coming in, his shoulders (previous surgeries) have been bothering him significantly. States his surgeon told him that he may need occasional prednisone tapers to help with the inflammation. He is requesting this - last taper was in > 6 months ago.    He denies any chest pain, dyspnea, lethargy, confusion, fevers, rashes, urinary changes, blood in urine, vomit, or stool.    Past medical history, Surgical history, Family history not pertinant except as noted below, Social history, Allergies, and medications have been entered into the medical record, reviewed, and corrections made.   Review of Systems:  All review of systems negative except what is listed in the HPI   Objective:    General:  Speaking clearly in complete sentences. Absent shortness of breath noted.   Alert and oriented x3.   Normal judgment.  Absent acute distress.   Impression and Recommendations:    1. Chronic insomnia I spoke with Dr. Ashley Royalty regarding your insomnia. I will write for Dayvigo (a newer insomnia med), but this will likely have to get approved by insurance. I'm attaching an information sheet for you to look over while we wait. You will not be able to take temazepam at the same time and should avoid alcohol while on this medication.   - Lemborexant (DAYVIGO) 5 MG TABS; Take 5 mg by mouth at bedtime as needed.  Dispense: 30 tablet; Refill: 0  2. Chronic pain of both shoulders For your shoulder pain, I am sending in a prednisone taper.   - predniSONE (STERAPRED UNI-PAK 21 TAB) 10 MG (21) TBPK tablet; Taper as directed on packaging  Dispense: 21 tablet; Refill: 0  3. Nausea and vomiting, unspecified vomiting type For your stomach symptoms - start  with a very bland diet and stay well hydrated, monitor for signs of dehydration and seek emergent care if symptoms become severe. I will send in a few zofran tablets to help with the nausea.   - ondansetron (ZOFRAN-ODT) 4 MG disintegrating tablet; Take 2 tablets (8 mg total) by mouth every 8 (eight) hours as needed for nausea or vomiting.  Dispense: 20 tablet; Refill: 0    Please call the office to schedule a follow-up appointment to update your labs in the next month or so.   Follow-up if symptoms  worsen or fail to improve.    I discussed the assessment and treatment plan with the patient. The patient was provided an opportunity to ask questions and all were answered. The patient agreed with the plan and demonstrated an understanding of the instructions.   The patient was advised to call back or seek an in-person evaluation if the symptoms worsen or if the condition fails to improve as anticipated.  I spent 20 minutes dedicated to the care of this patient on the date of this encounter to include pre-visit chart review of prior notes and results, face-to-face time with the patient, and post-visit ordering of testing as indicated.   Clayborne Dana, NP

## 2021-07-08 NOTE — Patient Instructions (Addendum)
-  For your shoulder pain, I am sending in a prednisone taper.  -For your stomach symptoms - start with a very bland diet and stay well hydrated, monitor for signs of dehydration and seek emergent care if symptoms become severe. I will send in a few zofran tablets to help with the nausea.  -I spoke with Dr. Ashley Royalty regarding your insomnia. I will write for Dayvigo (a newer insomnia med), but this will likely have to get approved by insurance. I'm attaching an information sheet for you to look over while we wait. You will not be able to take temazepam at the same time and should avoid alcohol while on this medication.  -Please call the office to schedule a follow-up appointment to update your labs in the next month or so.

## 2021-07-09 ENCOUNTER — Encounter: Payer: Self-pay | Admitting: Family Medicine

## 2021-07-10 ENCOUNTER — Telehealth: Payer: Self-pay

## 2021-07-10 NOTE — Telephone Encounter (Signed)
Medication: Lemborexant (DAYVIGO) 5 MG TABS Prior authorization submitted via CoverMyMeds on 07/10/2021 PA submission pending

## 2021-07-12 ENCOUNTER — Encounter: Payer: Self-pay | Admitting: Family Medicine

## 2021-07-13 NOTE — Telephone Encounter (Signed)
Medication: Lemborexant (DAYVIGO) 5 MG TABS Prior authorization determination received Medication has been denied Reason for denial:  "The medication is not on the formulary. The member must try and fail (did not work), or be unable to tale ALL formulary alternatives (due to interactions, side effects, etc.). In this case, other formulary alternatives are available for this member to take. The member has tried: generic Zolpidem, generic Zaleplon. Alternative medications include: generic Eszopiclone, Ramelteon, generic Doxepin capsules, etc."

## 2021-07-21 NOTE — Telephone Encounter (Addendum)
Medication: Lemborexant (DAYVIGO) 5 MG TABS Appeal determination received Medication has been denied Reason for denial:  "This medication is not on the formulary. The member must try and fail (did not work), or be unable to take ALL formulary alternatives (due to interactions, side effects, etc.).  In this case, other formulary alternatives are available for the member to take. The member has tried generic zaleplon, generic zolpidem, generic doxepin capsules.  Alternative medications include (please refer to members formulary): generic eszopiclone, ramelteon, etc."   I called [employer] about this and they said that I can file a second appeal but I need to get a signed release form from the patient, submit medical records again, as well as a letter of medical necessity.

## 2021-07-23 NOTE — Telephone Encounter (Signed)
Reviewed additional medications that they are wanting him to try.  He has tried Restaurant manager, fast food and is unable to tolerate ramelteon.  CM

## 2021-08-10 ENCOUNTER — Other Ambulatory Visit: Payer: Self-pay | Admitting: Family Medicine

## 2021-08-10 NOTE — Telephone Encounter (Signed)
Resent PA for DayVigo.

## 2021-08-27 ENCOUNTER — Other Ambulatory Visit: Payer: Self-pay | Admitting: Family Medicine

## 2021-08-29 IMAGING — DX DG FINGER MIDDLE 2+V*R*
3 series · 3 of 3 positions shown · non-contrast
Comparison: None.

CLINICAL DATA: Right middle finger pain after injury last week.

EXAM:
RIGHT MIDDLE FINGER 2+V

[finger ap]
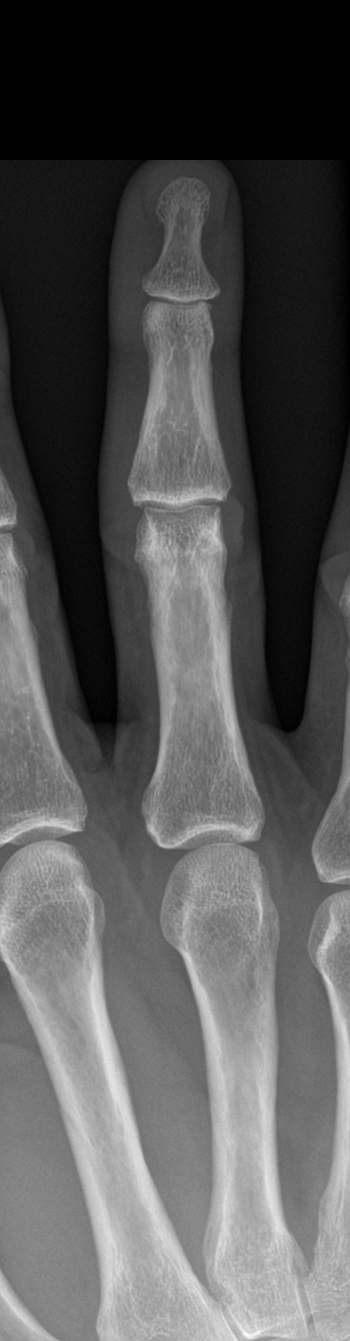

[finger obl]
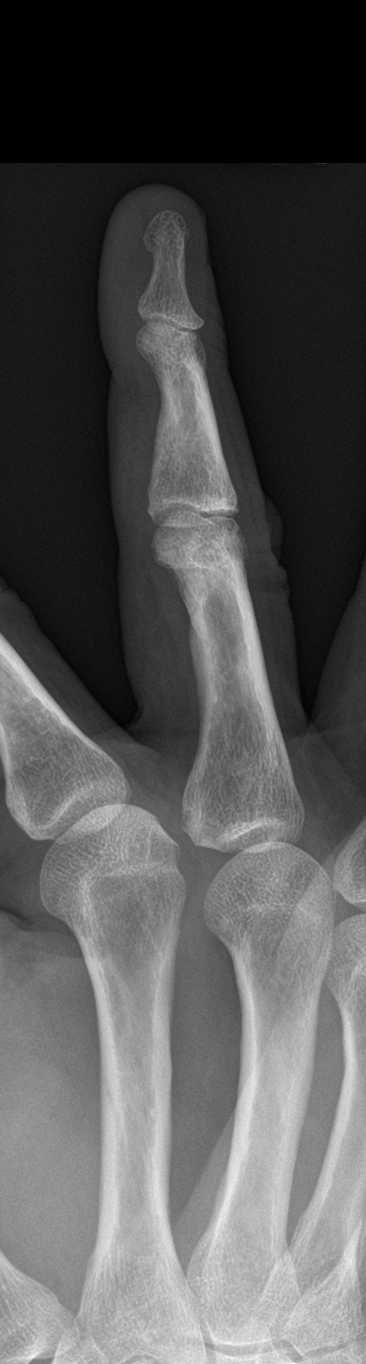

[finger lat]
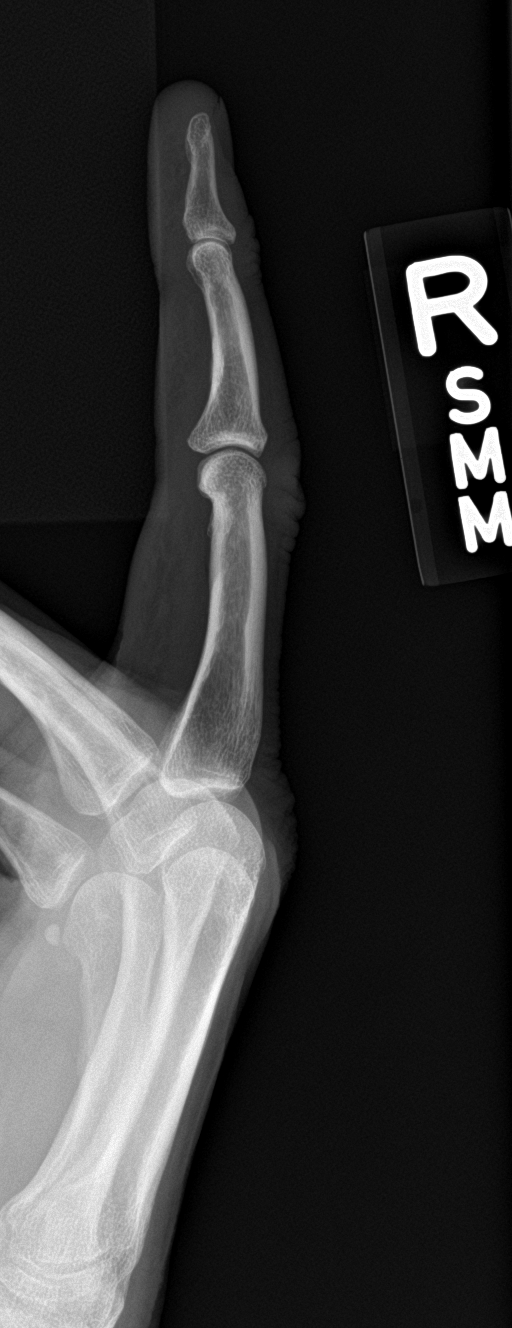

[3 of 3 positions shown; findings below may reference images not displayed]

FINDINGS: There is no evidence of fracture or dislocation. There is no
evidence of arthropathy or other focal bone abnormality. Soft
tissues are unremarkable.
IMPRESSION: Negative.

## 2021-12-12 ENCOUNTER — Other Ambulatory Visit: Payer: Self-pay | Admitting: Family Medicine

## 2022-01-10 ENCOUNTER — Other Ambulatory Visit: Payer: Self-pay | Admitting: Family Medicine

## 2022-01-10 ENCOUNTER — Other Ambulatory Visit: Payer: Self-pay

## 2022-01-10 IMAGING — US US SOFT TISSUE HEAD/NECK
1 series · 14 of 17 positions shown · non-contrast
Comparison: No pertinent prior exams available for comparison.

CLINICAL DATA: Enlarged lymph node. Enlarged lymph node, left side
of neck. Additional history provided by scanning technologist:
Possible enlarged lymph nodes for 2 weeks, recent shoulder surgery
(notice by anesthesiologist).

EXAM:
ULTRASOUND OF HEAD/NECK SOFT TISSUES
TECHNIQUE: Ultrasound examination of the head and neck soft tissues was
performed in the area of clinical concern.

[Series 1: us soft tissue head/neck · 0.03mm/px · 14 of 17 slices shown]
[im 1/17]
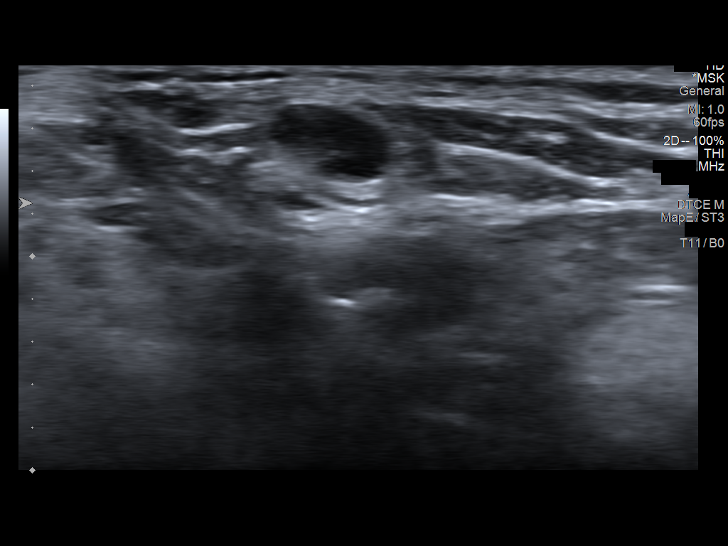
[im 2/17]
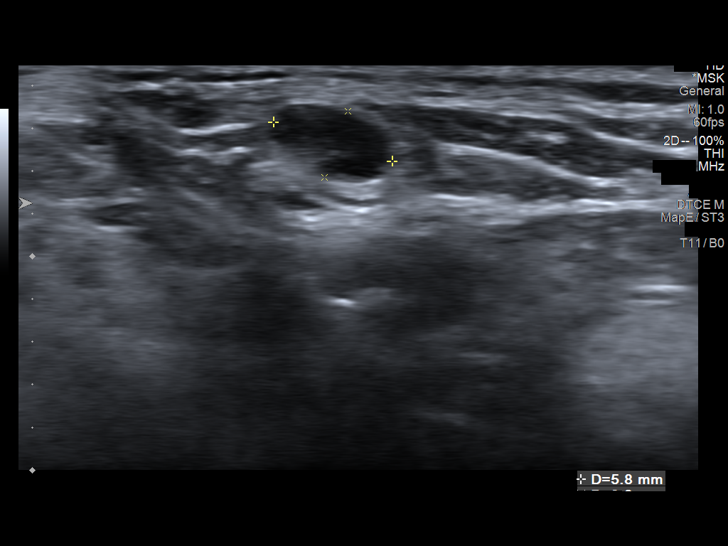
[im 4/17]
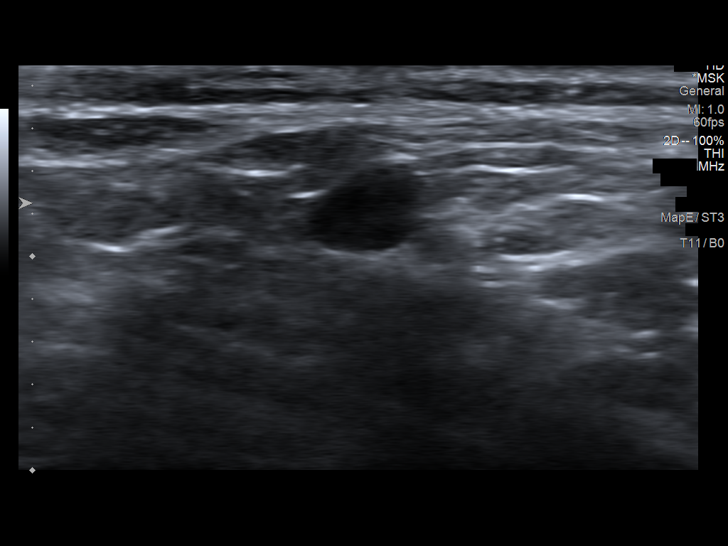
[im 5/17]
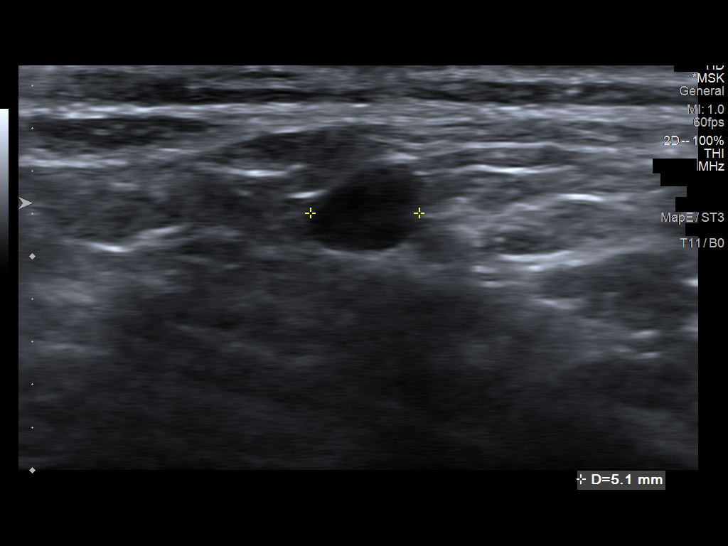
[im 6/17]
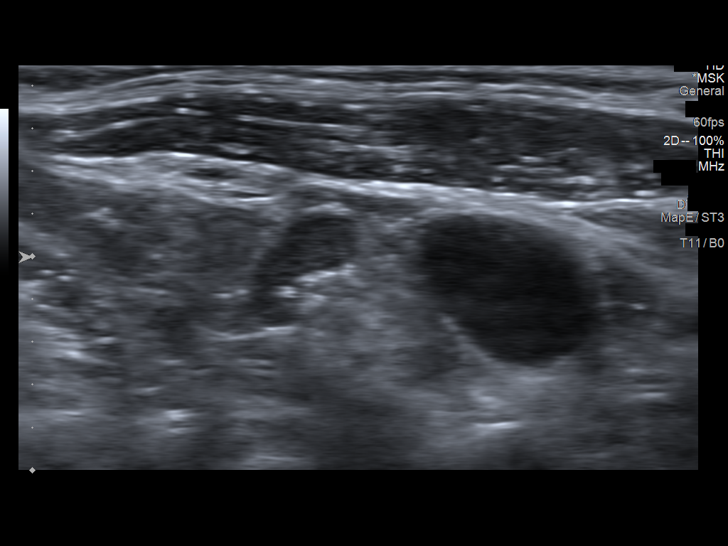
[im 7/17]
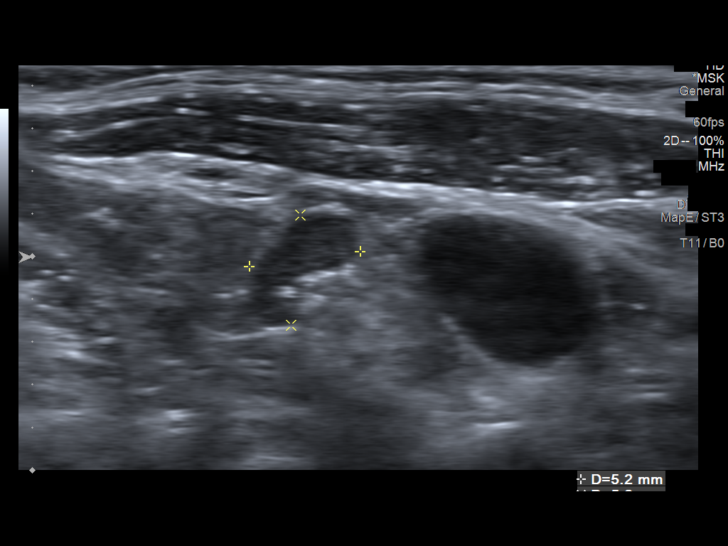
[im 8/17]
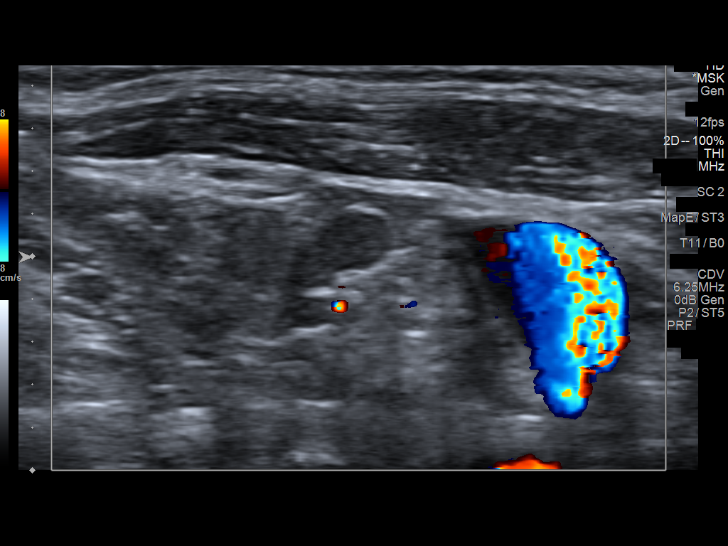
[im 10/17]
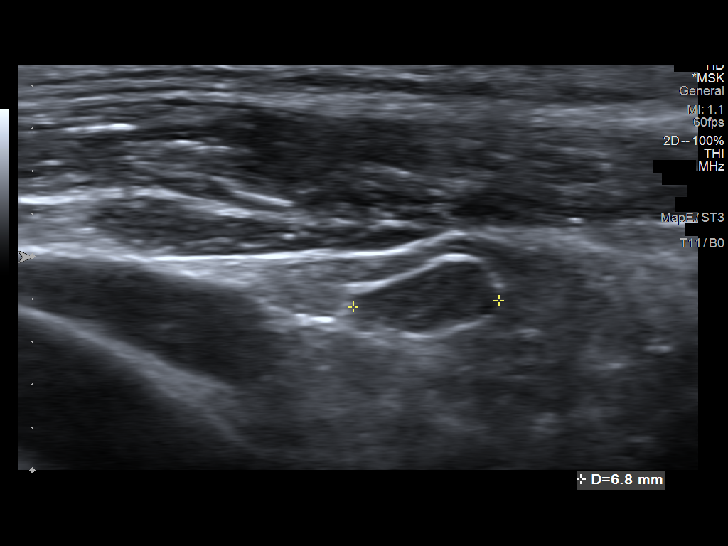
[im 11/17]
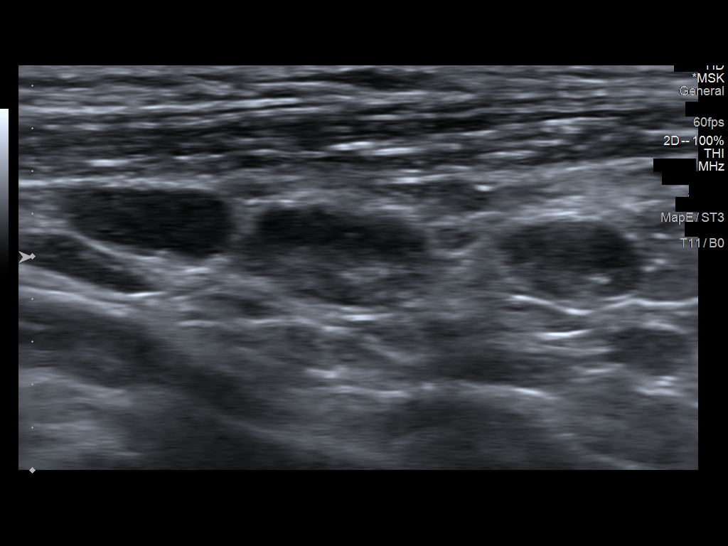
[im 12/17]
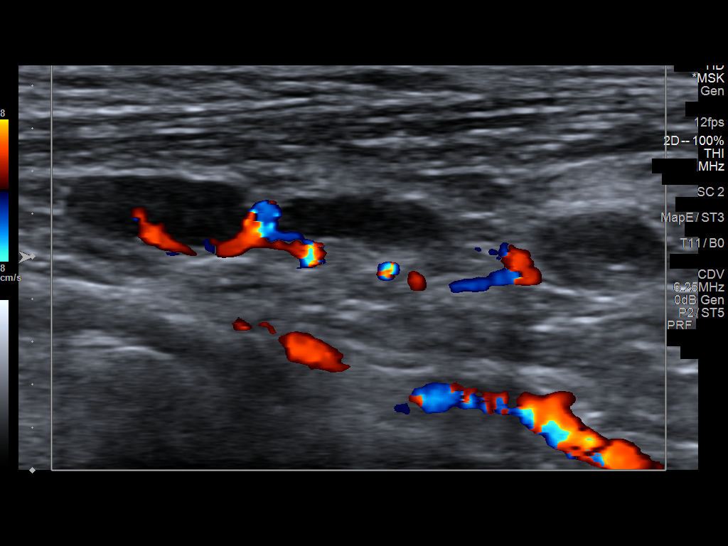
[im 13/17]
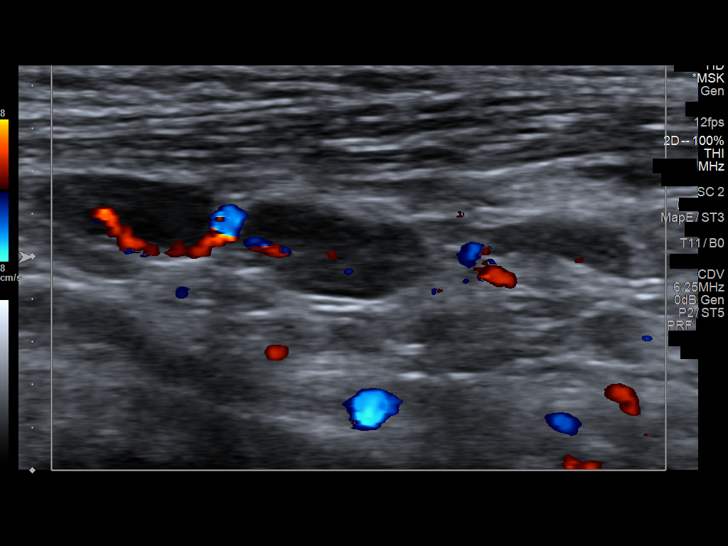
[im 14/17]
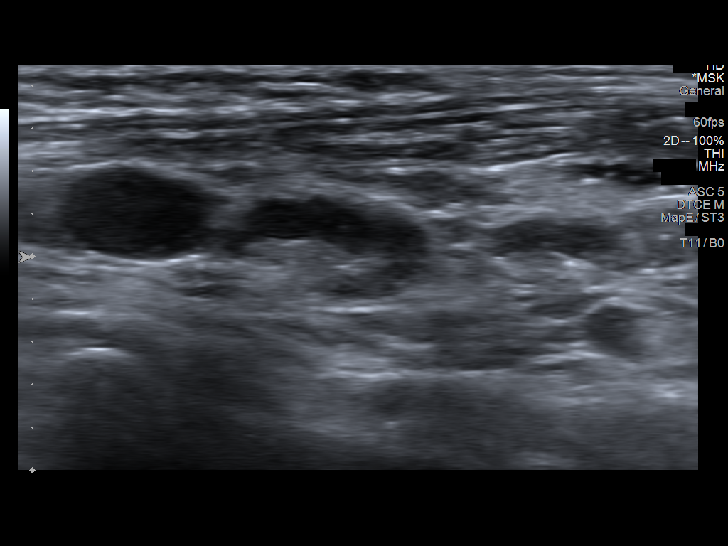
[im 16/17]
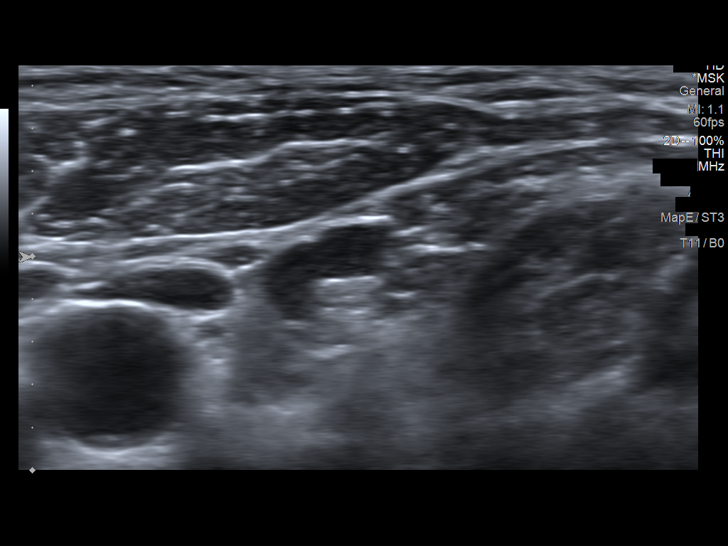
[im 17/17]
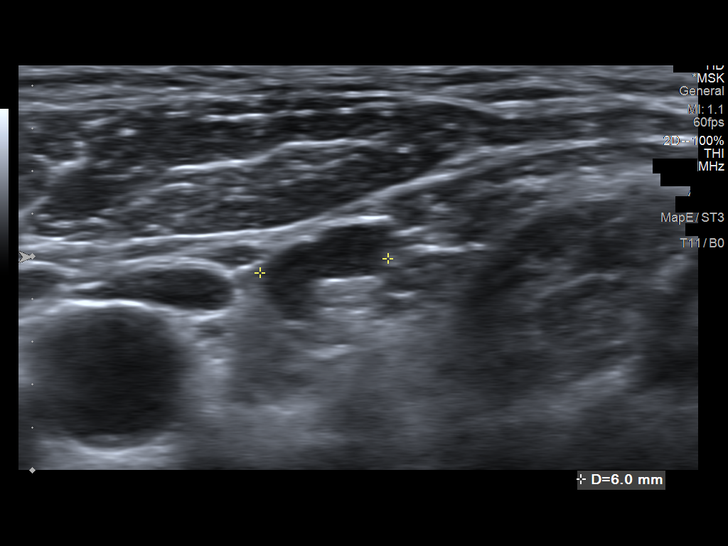

[14 of 17 positions shown; findings below may reference images not displayed]

FINDINGS: There are multiple ovoid hypoechoic foci within the left
neck/supraclavicular region compatible with lymph nodes. These lymph
nodes demonstrate central fatty hila. The visualized lymph nodes
measure 0.6 x 0.3 x 0.5 cm, 0.5 x 0.5 x 0.7 cm and 1.0 x 0.5 x
cm.
IMPRESSION: Visualized lymph nodes within the left neck/supraclavicular region
of concern are not enlarged by short axis criteria. A
contrast-enhanced neck CT may be obtained for further evaluation, as
clinically warranted.

No appreciable soft tissue mass within the left neck/supraclavicular
region of concern.

## 2022-01-10 NOTE — Telephone Encounter (Signed)
Pls contact pt to schedule appt. Last non-acute appt was 12/2020. Sending 90 day medication refill. Thanks

## 2022-01-10 NOTE — Telephone Encounter (Signed)
Patient stated he has new insurance and is with a new PCP under his current insurance!

## 2022-04-24 ENCOUNTER — Other Ambulatory Visit: Payer: Self-pay | Admitting: Family Medicine

## 2022-04-25 NOTE — Telephone Encounter (Signed)
Temazepam rx rf request received  Last visit with provider pcp  12/17/20 ( did have video visit with  taylor beck on 07/18/2021 No upcoming appt seen.

## 2022-04-26 NOTE — Telephone Encounter (Signed)
Overdue for office visit

## 2022-04-27 NOTE — Telephone Encounter (Signed)
Called patient and LVM to schedule an appointment for further refills and HTN. Joel James

## 2022-09-23 ENCOUNTER — Telehealth: Payer: Self-pay | Admitting: General Practice

## 2022-09-23 NOTE — Transitions of Care (Post Inpatient/ED Visit) (Signed)
   09/23/2022  Name: Joel James MRN: [id number] DOB: 01-Dec-1960  Today's TOC FU Call Status:    Transition Care Management Follow-up Telephone Call Date of Discharge: 09/22/22 Discharge Facility: Other ([facility]) Name of Other (Non-[facility]) Discharge Facility: [facility]  Patient stated that he has a new PCP due to insurance.    SIGNATURE: Tinnie Gens, RN BSN

## 2023-01-02 DEATH — deceased
